# Patient Record
Sex: Female | Born: 1985
Health system: Southern US, Community
[De-identification: ages and names within clinical notes are randomized; demographics above are authoritative.]

## PROBLEM LIST (undated history)

## (undated) DIAGNOSIS — F419 Anxiety disorder, unspecified: Secondary | ICD-10-CM

## (undated) DIAGNOSIS — Z720 Tobacco use: Secondary | ICD-10-CM

## (undated) HISTORY — PX: NO PAST SURGERIES: SHX2092

## (undated) HISTORY — DX: Tobacco use: Z72.0

---
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
## 2006-05-25 ENCOUNTER — Emergency Department (HOSPITAL_COMMUNITY): Admission: EM | Admit: 2006-05-25 | Discharge: 2006-05-26 | Payer: Self-pay | Admitting: Emergency Medicine

## 2007-01-02 ENCOUNTER — Telehealth (INDEPENDENT_AMBULATORY_CARE_PROVIDER_SITE_OTHER): Payer: Self-pay | Admitting: Family Medicine

## 2007-03-27 ENCOUNTER — Telehealth: Payer: Self-pay | Admitting: *Deleted

## 2007-10-03 ENCOUNTER — Ambulatory Visit: Payer: Self-pay | Admitting: Family Medicine

## 2007-10-03 DIAGNOSIS — E049 Nontoxic goiter, unspecified: Secondary | ICD-10-CM

## 2007-10-03 DIAGNOSIS — R030 Elevated blood-pressure reading, without diagnosis of hypertension: Secondary | ICD-10-CM

## 2007-10-03 DIAGNOSIS — R000 Tachycardia, unspecified: Secondary | ICD-10-CM

## 2008-03-28 ENCOUNTER — Emergency Department (HOSPITAL_COMMUNITY): Admission: EM | Admit: 2008-03-28 | Discharge: 2008-03-28 | Payer: Self-pay | Admitting: Emergency Medicine

## 2008-09-02 ENCOUNTER — Encounter: Payer: Self-pay | Admitting: Family Medicine

## 2008-10-28 ENCOUNTER — Inpatient Hospital Stay (HOSPITAL_COMMUNITY): Admission: AD | Admit: 2008-10-28 | Discharge: 2008-10-28 | Payer: Self-pay | Admitting: Obstetrics & Gynecology

## 2009-10-31 ENCOUNTER — Encounter (INDEPENDENT_AMBULATORY_CARE_PROVIDER_SITE_OTHER): Payer: Self-pay | Admitting: *Deleted

## 2009-10-31 DIAGNOSIS — F17200 Nicotine dependence, unspecified, uncomplicated: Secondary | ICD-10-CM

## 2010-06-10 ENCOUNTER — Emergency Department (HOSPITAL_COMMUNITY): Admission: EM | Admit: 2010-06-10 | Discharge: 2010-06-10 | Payer: Self-pay | Admitting: Emergency Medicine

## 2010-07-18 ENCOUNTER — Ambulatory Visit (HOSPITAL_COMMUNITY): Admission: RE | Admit: 2010-07-18 | Discharge: 2010-07-18 | Payer: Self-pay | Admitting: Obstetrics

## 2010-09-16 ENCOUNTER — Inpatient Hospital Stay (HOSPITAL_COMMUNITY)
Admission: AD | Admit: 2010-09-16 | Discharge: 2010-09-16 | Payer: Self-pay | Source: Home / Self Care | Admitting: Obstetrics & Gynecology

## 2010-10-07 ENCOUNTER — Inpatient Hospital Stay (HOSPITAL_COMMUNITY)
Admission: AD | Admit: 2010-10-07 | Discharge: 2010-10-07 | Payer: Self-pay | Source: Home / Self Care | Attending: Obstetrics & Gynecology | Admitting: Obstetrics & Gynecology

## 2010-11-28 NOTE — Assessment & Plan Note (Signed)
Summary: NP/ high blood pressure/last chance per denya   Vital Signs:  Patient Profile:   25 Years Old Female Weight:      152 pounds Temp:     98.2 degrees F Pulse rate:   138 / minute BP sitting:   137 / 80  (left arm)  Pt. in pain?   no  Vitals Entered By: Jacki Cones RN (October 03, 2007 3:36 PM)                  Serial Vital Signs/Assessments:  Time      Position  BP       Pulse  Resp  Temp     By                              56                    Lequita Asal  MD  Comments: repeat HR at end of visit By: Lequita Asal  MD    Visit Type:  Office Visit PCP:  Ancil Boozer  MD  Chief Complaint:  elevated bp when getting implanon x1 month ago.  History of Present Illness: Pt is a 25 y/o female who presents for evaluation for elevated BP. Pt states that when she went to get Implanon 1 month ago, she was told that she had an elevated BP. Denies h/o elevated BPs in the past. Given extensive history of HTN in her family, her mother thought it prudent for her to present for evaluation. Pt currently taking Seroquel for Bipolar disorder. Pt endorses occasional chest pain which per her paper medical record was worked up several years ago when she was 14, with negative work-up. Denies headache, peripheral edema, DOE, or visual changes. Pt does endorse frequent marijuana use and heavy EtOH use on weekends.      Family History:    Family History Hypertension   Risk Factors:  Tobacco use:  current    Cigarettes:  Yes -- 1 pack(s) per day    Counseled to quit/cut down tobacco use:  yes Drug use:  yes    Substance:  marijuana Alcohol use:  yes    Type:  Beer    Drinks per day:  4+    Counseled to quit/cut down alcohol use:  yes    Physical Exam  General:     Well-developed,well-nourished,in no acute distress; alert,appropriate and cooperative throughout examination Eyes:     No corneal or conjunctival inflammation noted. EOMI. Perrla. Funduscopic exam  benign, without hemorrhages, exudates or papilledema. Vision grossly normal. Lungs:     Normal respiratory effort, chest expands symmetrically. Lungs are clear to auscultation, no crackles or wheezes. Heart:     Normal rate and regular rhythm. S1 and S2 normal without gallop, murmur, click, rub or other extra sounds. Pulses:     R and L radial, dorsalis pedis and posterior tibial pulses are full and equal bilaterally Extremities:     No clubbing, cyanosis, edema, or deformity noted with normal full range of motion of all joints.   Psych:     Oriented X3, normally interactive, and judgment poor.      Impression & Recommendations:  Problem # 1:  ELEVATED BP READING WITHOUT DX HYPERTENSION (ICD-796.2) Assessment: New Given isolated elevated BP at [facility], and high normal SBP today, cannot diagnose HTN at this time. Pt counseled to measure BPs as an  outpatient at various times during the day for several weeks and to record the readings. If she gets multiple readings >140/90, she is to call the [facility]. Counseled to decrease alcohol intake and to stop smoking, and to engage at least 30 minutes of aerobic exercise daily in order to assist in prevention of development of HTN. Pt also counseled to increase intake of fruits and green leafy vegetables and to decrease salt intake.   Orders: [facility]- Est Level  3 ([id number])   Problem # 2:  TACHYCARDIA (ICD-785.0) Assessment: New pt significantly tachycardic at initial assessment. likely secondary to anxiety, as it decreased to 59 by end of visit. given h/o goiter, may need further w/u if persists in future.    Patient Instructions: 1)  Please schedule a follow-up appointment in 1 month with PCP, Dr. Sandi Mealy 2)  Tobacco is very bad for your health and your loved ones! You Should stop smoking!. 3)  Check your Blood Pressure regularly. Write down the readings and bring to your follow up visit. If multiple readings greater than 140/90, then call and make an  appointment sooner.  4)  It is not healthy  for women to drink more than 1-2 drinks per day. 5)  If you have problems with constipation, you may use Miralax 17g one to two times per day. It is available as an over-the-counter medication.  6)  Drink lots and lots of water and increase the amount of fiber and green leafy vegetables you eat.     ]

## 2010-11-28 NOTE — Miscellaneous (Signed)
Summary: Tobacco Tonya Scott  Clinical Lists Changes  Problems: Added new problem of TOBACCO Tonya Scott (ICD-305.1) 

## 2011-01-12 LAB — URINALYSIS, ROUTINE W REFLEX MICROSCOPIC
Bilirubin Urine: NEGATIVE
Glucose, UA: NEGATIVE mg/dL
Hgb urine dipstick: NEGATIVE
Ketones, ur: NEGATIVE mg/dL
Nitrite: NEGATIVE
Protein, ur: NEGATIVE mg/dL
Specific Gravity, Urine: 1.014 (ref 1.005–1.030)
Urobilinogen, UA: 1 mg/dL (ref 0.0–1.0)
pH: 7.5 (ref 5.0–8.0)

## 2011-01-12 LAB — POCT PREGNANCY, URINE: Preg Test, Ur: POSITIVE

## 2011-03-08 ENCOUNTER — Ambulatory Visit: Payer: Self-pay | Admitting: Family Medicine

## 2011-03-13 ENCOUNTER — Encounter: Payer: Self-pay | Admitting: Family Medicine

## 2011-03-13 ENCOUNTER — Ambulatory Visit (INDEPENDENT_AMBULATORY_CARE_PROVIDER_SITE_OTHER): Payer: Medicaid Other | Admitting: Family Medicine

## 2011-03-13 DIAGNOSIS — Z3009 Encounter for other general counseling and advice on contraception: Secondary | ICD-10-CM

## 2011-03-13 DIAGNOSIS — E049 Nontoxic goiter, unspecified: Secondary | ICD-10-CM

## 2011-03-13 DIAGNOSIS — F17200 Nicotine dependence, unspecified, uncomplicated: Secondary | ICD-10-CM

## 2011-03-13 NOTE — Patient Instructions (Signed)
It was nice to meet you. I will call or send letter with results of your thyroid test. Please schedule an appointment this Thursday morning w/ Dr. Jennette Kettle in Procedure clinic. Please schedule an adult physical appointment in 6 months. Thanks,  Dr. Sherron Flemings Tonya Scott

## 2011-03-14 ENCOUNTER — Encounter: Payer: Self-pay | Admitting: Family Medicine

## 2011-03-14 DIAGNOSIS — Z3009 Encounter for other general counseling and advice on contraception: Secondary | ICD-10-CM | POA: Insufficient documentation

## 2011-03-14 NOTE — Assessment & Plan Note (Signed)
Patient's goal is to quit smoking, but not ready to quit right now.  She smokes about 4 cigarettes per day, and smokes more when out drinking with friends on weekends.  Will continue to counsel patient about risks of smoking at subsequent visits.

## 2011-03-14 NOTE — Assessment & Plan Note (Signed)
Patient doing well post-partum.  Wants Implanon for [facility].  Will refer to Procedure Clinic this Thursday to insert Implanon.

## 2011-03-14 NOTE — Progress Notes (Signed)
  Subjective:    Patient ID: Tonya Scott, female    DOB: Jan 20, 1986, 25 y.o.   MRN: [id number]  HPI 25 year old female here for post partum visit and to meet new PCP.  Patient delivered viable female infant on 12/15/10.  No complications post-partum.  She is doing well overall.  No bleeding/spotting, abdominal pain or cramping.  Denies any post-partum fever, signs of infection.  Denies any symptoms of depression.  Currently sexually active without any form of birth control.  Wants Implanon for [facility].  Has used in the past.  Currently smokes about 4 cigarettes/day.  Goal for smoking: hopes to quit someday, but not ready at this time.   Review of Systems Per HPI    Objective:   Physical Exam  Constitutional: She appears well-developed and well-nourished. No distress.  Neck: Neck supple.  Cardiovascular: Normal rate and regular rhythm.  Exam reveals no gallop and no friction rub.   No murmur heard. Pulmonary/Chest: Breath sounds normal. No respiratory distress. She has no wheezes. She has no rales.  Abdominal: Soft. Bowel sounds are normal. She exhibits no distension. There is no tenderness.  Neurological: She is alert.  Skin: Skin is warm. No rash noted.          Assessment & Plan:

## 2011-03-14 NOTE — Assessment & Plan Note (Signed)
Patient has a history of thyroid disease.  She says thyroid studies have been normal in the past.  Concerned she has gained a significant amount of weight even before pregnancy.  Will order TSH level.  Patient to have this drawn at next visit (this Thursday).  Will call or send letter with results.

## 2011-03-15 ENCOUNTER — Encounter: Payer: Self-pay | Admitting: Family Medicine

## 2011-03-15 ENCOUNTER — Ambulatory Visit (INDEPENDENT_AMBULATORY_CARE_PROVIDER_SITE_OTHER): Payer: Medicaid Other | Admitting: Family Medicine

## 2011-03-15 DIAGNOSIS — E039 Hypothyroidism, unspecified: Secondary | ICD-10-CM

## 2011-03-15 DIAGNOSIS — Z309 Encounter for contraceptive management, unspecified: Secondary | ICD-10-CM

## 2011-03-15 DIAGNOSIS — Z3046 Encounter for surveillance of implantable subdermal contraceptive: Secondary | ICD-10-CM

## 2011-03-15 DIAGNOSIS — Z30017 Encounter for initial prescription of implantable subdermal contraceptive: Secondary | ICD-10-CM

## 2011-03-15 HISTORY — PX: OTHER SURGICAL HISTORY: SHX169

## 2011-03-15 MED ORDER — ETONOGESTREL 68 MG ~~LOC~~ IMPL
1.0000 | DRUG_IMPLANT | Freq: Once | SUBCUTANEOUS | Status: AC
  Administered 2011-03-15: 1 via SUBCUTANEOUS

## 2011-03-15 NOTE — Progress Notes (Signed)
  Subjective:    Patient ID: Tonya Scott, female    DOB: [birth year], 25 y.o.   MRN: [id number]  HPI Wants implanon Had one before and liked it a lot. Had baby 2 1/2 m old   Review of Systems    Pertinent review of systems: negative for fever or unusual weight change.  Objective:   Physical Exam     ARM: no lesions or rash Pregnancy test neg Patient given informed consent, signed copy in the chart. Pregnancy test was negative. Appropriate time out taken. Patient's left arm was prepped and draped in the usual sterile fashion.. The ruler used to measure and mark insertion area.  Pt was prepped with alcohol swab and then injected with 2 cc of 1% lidocaine with epinephrine. Pt the prepped with betadine, Implanon removed form packaging,  Device confirmed in needle, then inserted full length of needle and withdrawn per handbook instructions.  Pt insertion site covered with pressure bandage.  Minimal blood loss. Pt tolerated the procedure well.     Assessment & Plan:  Contraceptive management implanin insertion

## 2011-03-16 ENCOUNTER — Emergency Department (HOSPITAL_COMMUNITY)
Admission: EM | Admit: 2011-03-16 | Discharge: 2011-03-16 | Disposition: A | Discharge status: Home / Self Care | Payer: Medicare Other | Attending: Emergency Medicine | Admitting: Emergency Medicine

## 2011-03-16 DIAGNOSIS — Z23 Encounter for immunization: Secondary | ICD-10-CM | POA: Insufficient documentation

## 2011-03-16 DIAGNOSIS — Z203 Contact with and (suspected) exposure to rabies: Secondary | ICD-10-CM | POA: Insufficient documentation

## 2011-03-16 NOTE — Discharge Summary (Signed)
[facility]  Patient:    Tonya Scott, Tonya Scott                        MRN: [id number] Adm. Date:  [id number] Disc. Date: 09/30/00 Attending:  Veneta Penton                           Discharge Summary  REASON FOR ADMISSION:  This 25 year old black female was admitted for threatening to kill herself and her mother.  She was unable to contract for safety at that time.  For further history of present illness, please see the patients psychiatric admission assessment.  PHYSICAL EXAMINATION:  Her physical examination at the time of admission was unremarkable with the exception of a mild click or heart murmur heard on physical examination.  It is recommended that the patient follow up with her primary care physician and that an echocardiogram be considered.  Her physical examination was underwent unremarkable.  LABORATORY EXAMINATION:  The patient underwent a laboratory work-up to rule out any medical problems contributing to her symptomatology.  Urine probe for chlamydia and gonorrhea was negative.  A CBC showed a white count of 4.1000, MCHC was 34.7 and CBC was underwent unremarkable.  A metabolic panel was within normal limits. Thyroid function tests were within normal limits.  Urine pregnancy test was negative.  A hepatic panel was within normal limits.  Urine drug screen was positive for amphetamines and consistent with patient taking Adderall for ADHD.  UA was unremarkable.  The patient received no x-rays, no special procedures and no additional consultations during the course of this hospitalization.  She sustained no complications during the course of her hospitalization.  HOSPITAL COURSE:  The patient was oppositional, defiant, angry, irritable and depressed on admission.  She was offered a trial of antidepressant medication, but refused this stating that she, herself, did not feel depressed and did not want to take any antidepressant medicine.  She  did report that her primary problem was her lack of ability to maintain her focus and this prevented her from being able to manage her school work as well as pay attention in class. She reported having a great deal of difficulty as well with impulse control and controlling her anger.  She was begun on a trial of Dexedrine Spansules and tolerated this well.  She reports that this is improving things greater than her previous trial of Adderall.  She was discontinued from Risperdal as the patient has denied any psychotic symptomatology since she reports having had voices four months ago but they have been in remission since that time on a low dose of Risperdal. She has been off Risperdal now for approximately six days without any recurrent psychotic symptomatology.  Her psychotherapy while hospitalized has focused on decreasing cognitive distortions, improving her impulse control and she has done well.  At the time of discharge she is participating in all aspects of the therapeutic treatment program.  Her concentration and attention span have improved as has her affect and mood.  As she is participating in all aspects of the therapeutic treatment program, denies any suicidal or homicidal ideation, is adequately able to perform her activities of daily living, it is felt that the patient has reached her maximum benefits of hospitalization and is ready for discharge to a less restrictive alternative setting.  CONDITION ON DISCHARGE:  Improved.  DIAGNOSIS ACCORDING TO DSM-4: AXIS I.   1. Major  depression, single episode, severe without psychosis.           2. Rule out adjustment disorder with mixed disturbance of conduct              and emotions.           3. Attention deficit hyperactivity disorder combined type.           4. Conduct disorder.           5. Rule out oppositional defiant disorder.           6. Psychotic disorder in remission. AXIS II.  1. Antisocial traits.           2. Rule  out personality disorder.           3. Rule out learning disorder not otherwise specified. AXIS III. 1. Rule out mitral valve prolapse.           2. Rule out cardiovascular malformation. AXIS IV.  Current psychosocial stressors are severe. AXIS V.   Code 20 on admission and code 30 on discharge.  FURTHER EVALUATION AND TREATMENT RECOMMENDATIONS: 1. The patient is to follow up with her primary care physician within the    next two to four weeks to reevaluate the soft murmur that was heard on    initial physical examination and to rule out any vascular heart disease. 2. The patient is discharged on Dexedrine Spansules 10 mg p.o. q.a.m. 3. The patient is discharged on an unrestricted level of activity and a    regular diet. 4. The patient will follow up with the [facility] for    all further aspects of her mental health care and consequently I will    sign off on the case at this time. DD:  09/30/00 TD:  09/30/00 Job: [id number]

## 2011-03-16 NOTE — H&P (Signed)
[facility]  Patient:    Tonya Scott, Tonya Scott                        MRN: [id number] Adm. Date:  [id number] Attending:  Veneta Penton                   Psychiatric Admission Assessment  DATE OF ADMISSION:  September 24, 2000  PATIENT IDENTIFICATION:  This 25 year old black female was admitted after threatening to kill herself and her mother.  She was unable to contract for safety and refused to reveal her plans for harming herself and her mother.  HISTORY OF PRESENT ILLNESS:  The patient has been residing at the [facility].  The staff there reports that she has been responding to hallucinations over the past several weeks and that this has been worsening over the past week.  The patient denies this.  She does admit to having had hallucinations approximately four months ago when she was put on Risperdal. At that time, the hallucinations resolved.  She reported them as command auditory hallucinations telling her to hurt herself and others.  She also reports that she has been refusing to attend school.  She admits to an increasingly irritable, angry, and probably depressed mood most of the day nearly every day along with anhedonia, decreased hygiene, giving up on all activities that previously enjoyed, decreased appetite, insomnia, feelings of hopelessness, helplessness, worthlessness, excessive and inappropriate guilt, fatigue, loss of energy, decreased concentration, recurrent thoughts of death. She left a suicide note last week and has been verbalizing suicide for the past several weeks.  Psychosocial stressors include the fact that stepfather and grandmother died approximately two years ago after which mother reports that the patients behavior deteriorated and she has had repeated episodes of depression.  PAST PSYCHIATRIC HISTORY:  Hospitalized at [facility] as an inpatient in the early part of October 2001 because of homicidal  ideation directed toward her mother.  She had an episode of auditory hallucinations four months ago as described above that had done well on Risperdal and symptoms resolved.  The patient has a longstanding history of conduct disorder according to her mother who reports that the patient has been out of control with regard to her behavior for the past two years.  Problematic behaviors have included assaultive behavior, getting into fights with peers, she frequently lies and has had at least one episode of running away overnight. She has a longstanding history of ADHD since early childhood with symptoms of hyperactivity, poor impulse control, decreased concentration and attention span.  She has no past legal history.  She denies any other psychiatric history.  SUBSTANCE ABUSE HISTORY:  She refused to discuss any drug or alcohol use.  PAST MEDICAL HISTORY:  Current medications include Adderall 10 mg b.i.d. and Risperdal 0.5 mg q.h.s. but the patient reports she has been noncompliant with taking the medicine.  She has no known drug allergies or sensitivities.  Past medical history: Questionable history of an arrhythmia at age 65 that has not been present for a period of eight years; however, the patient also does report intermittent chest pain and physical examination by the physicians assistant found the patient to have either a soft murmur or possible click.  SOCIAL HISTORY:  There is no other family history available at this time.  The patient is currently attending eighth grade but doing poorly in school and refusing to go.  MENTAL STATUS EXAMINATION:  The patient presents as a well-developed, well-nourished, obese adolescent black female who is alert and oriented x 4, oppositional, defiant, and angry, disheveled, unkempt with poor impulse control, poor hygiene, decreased concentration and attention span.  She is hyperactive, psychomotor agitated, suspicious and guarded.  Speech  is pressured.  She displays no phonemic errors, looseness of associations, or evidence of a thought disorder.  Affect and mood are irritable, angry, and depressed.  She appears to be easily distractible although at the present time she does not appear to be responding to any internal stimuli.  Insight is poor, judgment is poor.  Intelligence appears to be average.  Similarities and differences are within normal limits.  Her proverbs are somewhat concrete and consistent with her educational level.  Thought processes appear to be generally goal directed.  ADMISSION DIAGNOSES: Axis I:    1. Major depression, single episode, severe without psychosis.            2. Attention-deficit/hyperactivity disorder, combined type.            3. Conduct disorder.            4. Rule out oppositional defiant disorder.            5. Rule out psychotic disorder, not otherwise specified. Axis II:   1. Antisocial traits.            2. Rule out personality disorder. Axis III:  1. Rule out mitral valve prolapse.            2. Rule out other cardiovascular malformation. Axis IV:   Severe. Axis V:    20.  ASSETS AND STRENGTHS:  She is of average intelligence and well connected into the DSS system.  INITIAL PLAN OF CARE:  Begin the patient on Dexedrine Spansules as she agree that she has attention-deficit disorder and has difficulty with concentrating in school and would like to improve there.  She refuses a trial of antidepressant medication at this time as she states that she does not feel depressed and does not want to go on an antidepressant at this time. Psychotherapy will focus on improving the patients impulse control, decreasing cognitive distortions, improving her anger management skills, and decreasing potential for harm to self and others.  A laboratory workup will also be initiated to rule out any medical problems contributing to her symptomatology.  An ECG will also be obtained electrocardiogram  will be considered or possibly considered for an outpatient basis.  ESTIMATED LENGTH OF STAY:  Five to seven days.  POST HOSPITAL CARE PLAN:  Discharge the patient back to care of her group home.DD:  09/25/00  TD:  09/25/00 Job: [id number]

## 2011-03-19 ENCOUNTER — Encounter: Payer: Self-pay | Admitting: Family Medicine

## 2011-03-22 ENCOUNTER — Ambulatory Visit: Payer: Medicaid Other

## 2011-03-23 ENCOUNTER — Ambulatory Visit: Payer: Medicaid Other

## 2011-04-13 ENCOUNTER — Ambulatory Visit: Payer: Medicaid Other | Admitting: Sports Medicine

## 2011-04-19 ENCOUNTER — Encounter: Payer: Self-pay | Admitting: Family Medicine

## 2011-04-19 ENCOUNTER — Ambulatory Visit (INDEPENDENT_AMBULATORY_CARE_PROVIDER_SITE_OTHER): Payer: Medicaid Other | Admitting: Family Medicine

## 2011-04-19 DIAGNOSIS — Z23 Encounter for immunization: Secondary | ICD-10-CM

## 2011-04-19 DIAGNOSIS — Z20828 Contact with and (suspected) exposure to other viral communicable diseases: Secondary | ICD-10-CM | POA: Insufficient documentation

## 2011-04-19 DIAGNOSIS — N760 Acute vaginitis: Secondary | ICD-10-CM | POA: Insufficient documentation

## 2011-04-19 LAB — POCT WET PREP (WET MOUNT)
Trichomonas Wet Prep HPF POC: NEGATIVE
Yeast Wet Prep HPF POC: NEGATIVE

## 2011-04-19 NOTE — Progress Notes (Signed)
  Subjective:    Patient ID: Tonya Scott, female    DOB: 09/20/1986, 25 y.o.   MRN: [id number]  HPI  1) STD check: Patient with mild vaginal discharge without odor. Would like STD testing - would also like testing for viral STD HIV and syphilis. Denies abdominal pain, nausea , emesis, diarrhea, vaginal pain, dysuria, fever, chills, weight loss. Sexually active and does not use protection. Implanon in place.   Past history reviewed.   Would also like tetanus vaccine today (booster)  Review of Systems As per HPI    Objective:   Physical Exam General: pleasant, NAD  GU: scant clear vaginal discharge without odor, no cervical motion or uterine tenderness       Assessment & Plan:

## 2011-04-19 NOTE — Assessment & Plan Note (Signed)
HIV, RPR today at patient request. Counseled on safe sexual practices.

## 2011-04-19 NOTE — Progress Notes (Signed)
Addended by: Garen Grams F on: 04/19/2011 09:45 AM   Modules accepted: Orders

## 2011-04-19 NOTE — Patient Instructions (Signed)
I will call you with your results.  It was great to meet you today! Follow up as needed.  - Dr. Wallene Huh

## 2011-04-19 NOTE — Assessment & Plan Note (Addendum)
GC/CT/wet prep today. Follow up results. Counseled on safe sexual practices.   - Bacterial vaginosis. Called in metronidazole treatment and notified patient.

## 2011-04-20 LAB — GC/CHLAMYDIA PROBE AMP, GENITAL: GC Probe Amp, Genital: NEGATIVE

## 2011-04-24 MED ORDER — METRONIDAZOLE 500 MG PO TABS: 500.0000 mg | ORAL_TABLET | Freq: Two times a day (BID) | ORAL | Status: AC

## 2011-04-24 NOTE — Progress Notes (Signed)
Addended by: Gennette Pac on: 04/24/2011 12:03 PM   Modules accepted: Orders

## 2011-06-21 ENCOUNTER — Telehealth: Payer: Self-pay | Admitting: Family Medicine

## 2011-06-21 NOTE — Telephone Encounter (Signed)
The referral for Home Care did not have any Dx codes on it.  She we like the referral updated and faxed back.

## 2011-06-21 NOTE — Telephone Encounter (Signed)
Referral information done and faxed back .Marland KitchenLoralee Scott [place]

## 2011-07-03 ENCOUNTER — Encounter: Payer: Self-pay | Admitting: Family Medicine

## 2011-07-03 DIAGNOSIS — F319 Bipolar disorder, unspecified: Secondary | ICD-10-CM | POA: Insufficient documentation

## 2011-11-07 ENCOUNTER — Ambulatory Visit (INDEPENDENT_AMBULATORY_CARE_PROVIDER_SITE_OTHER): Payer: Medicare Other | Admitting: Family Medicine

## 2011-11-07 ENCOUNTER — Encounter: Payer: Self-pay | Admitting: Family Medicine

## 2011-11-07 VITALS — BP 146/91 | HR 102 | Temp 98.4°F | Ht 67.0 in | Wt 210.0 lb

## 2011-11-07 DIAGNOSIS — M545 Low back pain, unspecified: Secondary | ICD-10-CM | POA: Insufficient documentation

## 2011-11-07 DIAGNOSIS — H43393 Other vitreous opacities, bilateral: Secondary | ICD-10-CM | POA: Insufficient documentation

## 2011-11-07 DIAGNOSIS — H43399 Other vitreous opacities, unspecified eye: Secondary | ICD-10-CM

## 2011-11-07 MED ORDER — MELOXICAM 15 MG PO TABS: 15.0000 mg | ORAL_TABLET | Freq: Every day | ORAL | Status: DC

## 2011-11-07 NOTE — Patient Instructions (Addendum)
It was nice meeting you today.  I would like for you to try the meloxicam for your low back pain Your blood pressure has looked fine at other appointments so I do not believe that this is causing the spots in your eye, I would like to send you to an eye doctor so that the can visualize the back on the eye more clearly.   If you have questions please call our clinic

## 2011-11-11 NOTE — Assessment & Plan Note (Signed)
Unable to to visualize disc.  Unsure of etiology, she does have elevated blood pressure but looks to be isolated.  Concerned that this is affecting her vision, will refer to ophtho for further evaluation.

## 2011-11-11 NOTE — Progress Notes (Signed)
  Subjective:    Patient ID: Tonya Scott, female    DOB: 1985/11/11, 26 y.o.   MRN: [id number]  HPI  1.  Spots in vision:  Noticed spots and floaters in her vision ~1 month ago.  Notices these several times per day, and makes it difficult to see at times.  Describes floaters as "black spots" with some green "flashes"  This is mad worse when she lays down.  She does have occasional headaches, however these aren't related to her visual disturbances.  She denies any trauma.      2.  Low back pain:  Has had low back pain since childbirth ~10 mos ago.  Feels like tightness in her back.  Pain does not radiate, thinks this may be from her epidural.  She denies bowel/bladder incontinence or numbness down the legs.   Review of Systems     Objective:   Physical Exam  Constitutional: She is oriented to person, place, and time. She appears well-developed and well-nourished. No distress.  HENT:  Head: Normocephalic and atraumatic.  Eyes: Conjunctivae and EOM are normal. Pupils are equal, round, and reactive to light.       Unable to visualize disc, no av nicking seen  Neck: Neck supple.  Cardiovascular: Normal rate, regular rhythm and normal heart sounds.   Pulmonary/Chest: Effort normal and breath sounds normal.  Musculoskeletal:       SLR normal, gait normal.    Neurological: She is alert and oriented to person, place, and time. No cranial nerve deficit.          Assessment & Plan:

## 2011-11-11 NOTE — Assessment & Plan Note (Signed)
No red flags, will treat with NSAIDS.  Muscle relaxants offered, does not want anything sedating.

## 2011-11-12 ENCOUNTER — Telehealth: Payer: Self-pay | Admitting: *Deleted

## 2011-11-12 NOTE — Telephone Encounter (Signed)
Called patient and informed of appointment with [facility] on 11/16/11 at 10:30 am.Busick, Rodena Medin

## 2012-02-27 ENCOUNTER — Ambulatory Visit: Payer: Medicare Other | Admitting: Family Medicine

## 2012-03-07 ENCOUNTER — Encounter: Payer: Self-pay | Admitting: *Deleted

## 2012-03-27 NOTE — Telephone Encounter (Signed)
This encounter was created in error - please disregard.

## 2012-04-21 ENCOUNTER — Ambulatory Visit: Payer: Medicare Other | Admitting: Family Medicine

## 2012-05-12 ENCOUNTER — Ambulatory Visit: Payer: Medicare Other | Admitting: Family Medicine

## 2012-06-09 ENCOUNTER — Telehealth: Payer: Self-pay | Admitting: Family Medicine

## 2012-06-09 ENCOUNTER — Ambulatory Visit: Payer: Medicare Other | Admitting: Family Medicine

## 2012-06-09 NOTE — Telephone Encounter (Signed)
Opened in error

## 2012-06-11 ENCOUNTER — Ambulatory Visit (INDEPENDENT_AMBULATORY_CARE_PROVIDER_SITE_OTHER): Payer: Medicare Other | Admitting: Family Medicine

## 2012-06-11 ENCOUNTER — Encounter: Payer: Self-pay | Admitting: Family Medicine

## 2012-06-11 VITALS — BP 144/83 | HR 116 | Ht 67.0 in | Wt 228.0 lb

## 2012-06-11 DIAGNOSIS — R609 Edema, unspecified: Secondary | ICD-10-CM

## 2012-06-11 DIAGNOSIS — E049 Nontoxic goiter, unspecified: Secondary | ICD-10-CM

## 2012-06-11 DIAGNOSIS — R600 Localized edema: Secondary | ICD-10-CM

## 2012-06-11 NOTE — Patient Instructions (Addendum)
Thank you for coming in today, it was good to see you We will check your thyroid today to see if that may be contributing to your symptoms It can be normal for you to have some swelling in your feet at the end of the day, be sure to keep your feet elevated when you are resting.  I will see you back in 1-2 month to see how things are going

## 2012-06-12 LAB — TSH: TSH: 0.755 u[IU]/mL (ref 0.350–4.500)

## 2012-06-15 DIAGNOSIS — R600 Localized edema: Secondary | ICD-10-CM | POA: Insufficient documentation

## 2012-06-15 NOTE — Progress Notes (Signed)
  Subjective:    Patient ID: Tonya Scott, female    DOB: 1986/09/10, 26 y.o.   MRN: [id number]  HPI 1. Foot swelling:  Has noticed that she has had bilateral foot swelling since having baby in February.  Swelling worse at the end of the day when she has been on her feet for a long period of time.  Swelling is gone when she wakes in the morning.  She states she has had trouble losing weight since baby was born and thinks that swelling and difficulty with weight loss may be related to thyroid.  She has had hx of goiter and received radioactive Iodine.  She has never been on supplementation.  She also describes "palpitations" but she relates this to being similar to panic attacks she has had before.  She denies hot/cold intolerance, headache, pain in legs, chest pain, shortness of breath.    Review of Systems Per HPI    Objective:   Physical Exam  Constitutional:       Obese female, NAD   HENT:  Mouth/Throat: Oropharynx is clear and moist.  Neck: Neck supple.       Thyroid mildly enlarged, symmetric,  non tender.   Cardiovascular: Normal rate and regular rhythm.   Pulmonary/Chest: Effort normal.  Musculoskeletal: She exhibits edema (Trace).  Neurological: She is alert.          Assessment & Plan:

## 2012-06-15 NOTE — Assessment & Plan Note (Signed)
Worsening leg edema since delivery of child in 2/2.  Description sounds like this is dependent edema.  No signs of DVT or heart failure. Cause likely related to venous insufficiency 2/2 to obesity.  However given history of thyroid disease, I will check a TSH.  Advised weight loss staring with dietary changes and exercise program.

## 2012-06-17 ENCOUNTER — Telehealth: Payer: Self-pay | Admitting: Family Medicine

## 2012-06-17 NOTE — Telephone Encounter (Signed)
Would like to to know results of labs

## 2012-06-17 NOTE — Telephone Encounter (Signed)
Fwd. To Dr.Matthews. .Tonya Scott  

## 2012-06-17 NOTE — Telephone Encounter (Signed)
Called and given results of thyroid testing.  Tests were normal.

## 2012-07-25 ENCOUNTER — Encounter: Payer: Self-pay | Admitting: Family Medicine

## 2012-07-25 ENCOUNTER — Ambulatory Visit (INDEPENDENT_AMBULATORY_CARE_PROVIDER_SITE_OTHER): Payer: Medicare Other | Admitting: Family Medicine

## 2012-07-25 VITALS — BP 121/83 | HR 100 | Ht 67.0 in | Wt 230.6 lb

## 2012-07-25 DIAGNOSIS — Z3046 Encounter for surveillance of implantable subdermal contraceptive: Secondary | ICD-10-CM

## 2012-07-27 DIAGNOSIS — Z3046 Encounter for surveillance of implantable subdermal contraceptive: Secondary | ICD-10-CM | POA: Insufficient documentation

## 2012-07-27 NOTE — Progress Notes (Signed)
  Subjective:    Patient ID: Tonya Scott, female    DOB: [birth year], 26 y.o.   MRN: [id number]  HPI  1. Contraception management:  Here for nexplanon removal.  Wishes to have this removed because she feels it may be contributing to her gaining weight.  She is unsure what she would like to use for contraception after removal.  She would like to use OCP but she is a smoker.    Review of Systems Denies irregular or heavy menses.      Objective:   Physical Exam  PROCEDURE NOTE: Nexplanon Removal Patient given informed consent for removal of her implanon.  Signed copy in the chart.  Appropriate time out taken.   Nexplanon site identified.  Area prepped in usual sterile fashon. One cc of 1% lidocaine was used to anesthetize the area at the distal end of the implant. A small stab incision was made right beside the implant on the distal portion. The implanon rod was grasped using hemostats and removed without difficulty.  There was less than 3 cc blood loss. There were no complications.  A small amount of antibiotic ointment and steri-strips were applied over the small incision.  A pressure bandage was applied to reduce any bruising.  The patient tolerated the procedure well and was given post procedure instructions.   Supervised by Dr. Gwendolyn Grant    Assessment & Plan:

## 2012-07-27 NOTE — Assessment & Plan Note (Signed)
Nexplanon removed, patient tolerated procedure well.  She is undecided on another form of contraception at this time.  I did advise against combo OCP due to her continuing to smoke.  Discussed IUD, she will think about this.

## 2012-12-24 ENCOUNTER — Encounter: Payer: Medicare Other | Admitting: Family Medicine

## 2013-05-08 ENCOUNTER — Telehealth: Payer: Self-pay | Admitting: Emergency Medicine

## 2013-05-12 ENCOUNTER — Ambulatory Visit: Payer: Medicare Other | Admitting: Family Medicine

## 2013-05-13 NOTE — Telephone Encounter (Signed)
error 

## 2013-05-20 ENCOUNTER — Ambulatory Visit: Payer: Medicare Other | Admitting: Emergency Medicine

## 2014-02-08 ENCOUNTER — Ambulatory Visit: Payer: Medicare Other | Admitting: Emergency Medicine

## 2015-05-10 ENCOUNTER — Encounter: Payer: Medicare Other | Admitting: Family Medicine

## 2016-12-19 ENCOUNTER — Ambulatory Visit (INDEPENDENT_AMBULATORY_CARE_PROVIDER_SITE_OTHER): Payer: Medicare Other | Admitting: Family Medicine

## 2016-12-19 ENCOUNTER — Other Ambulatory Visit (HOSPITAL_COMMUNITY)
Admission: RE | Admit: 2016-12-19 | Discharge: 2016-12-19 | Disposition: A | Discharge status: Home / Self Care | Payer: Medicare Other | Source: Ambulatory Visit | Attending: Family Medicine | Admitting: Family Medicine

## 2016-12-19 ENCOUNTER — Ambulatory Visit: Payer: Medicare Other | Admitting: Family Medicine

## 2016-12-19 VITALS — BP 119/80 | HR 81 | Temp 98.2°F | Ht 67.0 in | Wt 208.0 lb

## 2016-12-19 DIAGNOSIS — Z609 Problem related to social environment, unspecified: Secondary | ICD-10-CM | POA: Diagnosis not present

## 2016-12-19 DIAGNOSIS — N760 Acute vaginitis: Secondary | ICD-10-CM

## 2016-12-19 DIAGNOSIS — Z114 Encounter for screening for human immunodeficiency virus [HIV]: Secondary | ICD-10-CM | POA: Diagnosis not present

## 2016-12-19 DIAGNOSIS — B9689 Other specified bacterial agents as the cause of diseases classified elsewhere: Secondary | ICD-10-CM

## 2016-12-19 DIAGNOSIS — Z113 Encounter for screening for infections with a predominantly sexual mode of transmission: Secondary | ICD-10-CM | POA: Insufficient documentation

## 2016-12-19 DIAGNOSIS — E049 Nontoxic goiter, unspecified: Secondary | ICD-10-CM

## 2016-12-19 DIAGNOSIS — R5383 Other fatigue: Secondary | ICD-10-CM | POA: Diagnosis not present

## 2016-12-19 DIAGNOSIS — Z01419 Encounter for gynecological examination (general) (routine) without abnormal findings: Secondary | ICD-10-CM | POA: Insufficient documentation

## 2016-12-19 DIAGNOSIS — Z20828 Contact with and (suspected) exposure to other viral communicable diseases: Secondary | ICD-10-CM

## 2016-12-19 LAB — COMPLETE METABOLIC PANEL WITH GFR
ALT: 12 U/L (ref 6–29)
AST: 15 U/L (ref 10–30)
Albumin: 4.2 g/dL (ref 3.6–5.1)
Alkaline Phosphatase: 132 U/L — ABNORMAL HIGH (ref 33–115)
BILIRUBIN TOTAL: 0.8 mg/dL (ref 0.2–1.2)
BUN: 8 mg/dL (ref 7–25)
CALCIUM: 9.4 mg/dL (ref 8.6–10.2)
CO2: 23 mmol/L (ref 20–31)
CREATININE: 0.83 mg/dL (ref 0.50–1.10)
Chloride: 105 mmol/L (ref 98–110)
GFR, Est Non African American: >89 mL/min (ref >=60)
Glucose, Bld: 81 mg/dL (ref 65–99)
Potassium: 4 mmol/L (ref 3.5–5.3)
Sodium: 138 mmol/L (ref 135–146)
TOTAL PROTEIN: 7.3 g/dL (ref 6.1–8.1)

## 2016-12-19 LAB — POCT WET PREP (WET MOUNT)
CLUE CELLS WET PREP WHIFF POC: POSITIVE
TRICHOMONAS WET PREP HPF POC: ABSENT

## 2016-12-19 LAB — CBC
HCT: 44.3 % (ref 35.0–45.0)
Hemoglobin: 14.9 g/dL (ref 11.7–15.5)
MCH: 33.6 pg — ABNORMAL HIGH (ref 27.0–33.0)
MCHC: 33.6 g/dL (ref 32.0–36.0)
MCV: 99.8 fL (ref 80.0–100.0)
MPV: 10.2 fL (ref 7.5–12.5)
PLATELETS: 253 10*3/uL (ref 140–400)
RBC: 4.44 MIL/uL (ref 3.80–5.10)
RDW: 13.2 % (ref 11.0–15.0)
WBC: 8 10*3/uL (ref 3.8–10.8)

## 2016-12-19 LAB — TSH: TSH: 2.19 mIU/L

## 2016-12-19 NOTE — Patient Instructions (Signed)
Nice to meet you today. We are getting some labs and someone will call you or send you a letter with the results when they're available.  Please make a follow-up appointment for any concerns that you have that we were unable to address today.  Take care, Dr. BLeonard Schwartz

## 2016-12-19 NOTE — Progress Notes (Signed)
Subjective:   Tonya Scott is a 31 y.o. female with a history of Tobacco use, goiter, bipolar disorder here for STD testing and thyroid testing  STD testing LMP - end of January, not sure Sexually active with 1 female partner [phone]  No known exposure No current contraception  Thyroid issues Was told during pregnancy that thyroid was abnormal and needed f/u Remembers having a goiter at age 89 Had radioactive iodine uptake scan done (2001) - normal Weight gain since pregnancy Wants it rechecked Swelling in feet intermittently  Blood pressure concerns Patient also wonders if her blood pressure is okay today She's never had any blood pressure problems are taken any medication for blood pressure She took her blood pressure at [employer] 1 day and it was in the 160s systolic which concerned her  Review of Systems:  Per HPI.   Social History: Current smoker  Objective:  BP 119/80 (BP Location: Left Arm, Patient Position: Sitting, Cuff Size: Normal)   Pulse 81   Temp 98.2 F (36.8 C) (Oral)   Ht 5\' 7"  (1.702 m)   Wt 208 lb (94.3 kg)   SpO2 96%   BMI 32.58 kg/m   Gen:  31 y.o. female in NAD HEENT: NCAT, MMM, EOMI, PERRL, anicteric sclerae Neck: Supple, no LAD, +goiter CV: RRR, no MRG Resp: Non-labored, CTAB, no wheezes noted Abd: Soft, NTND, BS present, no guarding or organomegaly GYN:  External genitalia within normal limits.  Vaginal mucosa pink, moist, normal rugae.  Nonfriable cervix without lesions, no discharge or bleeding noted on speculum exam.  Bimanual exam revealed normal, nongravid uterus.  No cervical motion tenderness. No adnexal masses bilaterally.   Ext: WWP, no edema MSK: No obvious deformities, gait intact Neuro: Alert and oriented, speech normal       Chemistry      Component Value Date/Time   NA 138 12/19/2016 1605   K 4.0 12/19/2016 1605   CL 105 12/19/2016 1605   CO2 23 12/19/2016 1605   BUN 8 12/19/2016 1605   CREATININE 0.83 12/19/2016  1605      Component Value Date/Time   CALCIUM 9.4 12/19/2016 1605   ALKPHOS 132 (H) 12/19/2016 1605   AST 15 12/19/2016 1605   ALT 12 12/19/2016 1605   BILITOT 0.8 12/19/2016 1605      Lab Results  Component Value Date   WBC 8.0 12/19/2016   HGB 14.9 12/19/2016   HCT 44.3 12/19/2016   MCV 99.8 12/19/2016   PLT 253 12/19/2016   Lab Results  Component Value Date   TSH 2.19 12/19/2016   No results found for: HGBA1C Assessment & Plan:     Tonya Scott is a 31 y.o. female here for   Bacterial vaginosis Clue cells and positive whiff test on wet prep Informed patient Treat with Flagyl BID x7d  Goiter History of goiter with normal TSH and normal radioactive iodine scan Recheck TSH - normal Reassured patient that no further testing is indicated at this time Could consider yearly TSH screening  Screen for STD (sexually transmitted disease) Discussed safe sex practices Check HIV, RPR, GC chlamydia, wet prep Also check Pap smear as patient is due for Pap smear  Fatigue Likely multifactorial Discussed sleep hygiene with the patient Check TSH as above Also check CMP and CBC   Patient also asking at the end of the visit if she might have sleep apnea Advised her to make a follow-up appointment so that we can give this appropriate  evaluation  Erasmo Downer, MD MPH PGY-3,  [facility] 12/20/2016  2:36 PM

## 2016-12-20 DIAGNOSIS — N760 Acute vaginitis: Secondary | ICD-10-CM

## 2016-12-20 DIAGNOSIS — B9689 Other specified bacterial agents as the cause of diseases classified elsewhere: Secondary | ICD-10-CM | POA: Insufficient documentation

## 2016-12-20 LAB — HIV ANTIBODY (ROUTINE TESTING W REFLEX): HIV: NONREACTIVE

## 2016-12-20 LAB — RPR

## 2016-12-20 MED ORDER — METRONIDAZOLE 500 MG PO TABS: 500.0000 mg | ORAL_TABLET | Freq: Two times a day (BID) | ORAL | 0 refills | Status: DC

## 2016-12-20 NOTE — Assessment & Plan Note (Signed)
Discussed safe sex practices Check HIV, RPR, GC chlamydia, wet prep Also check Pap smear as patient is due for Pap smear

## 2016-12-20 NOTE — Assessment & Plan Note (Signed)
History of goiter with normal TSH and normal radioactive iodine scan Recheck TSH - normal Reassured patient that no further testing is indicated at this time Could consider yearly TSH screening

## 2016-12-20 NOTE — Assessment & Plan Note (Signed)
Likely multifactorial Discussed sleep hygiene with the patient Check TSH as above Also check CMP and CBC

## 2016-12-20 NOTE — Assessment & Plan Note (Signed)
Clue cells and positive whiff test on wet prep Informed patient Treat with Flagyl BID x7d

## 2016-12-21 ENCOUNTER — Telehealth: Payer: Self-pay | Admitting: Family Medicine

## 2016-12-21 LAB — CYTOLOGY - PAP
ADEQUACY: ABSENT
DIAGNOSIS: NEGATIVE

## 2016-12-21 LAB — CERVICOVAGINAL ANCILLARY ONLY
Chlamydia: NEGATIVE
Neisseria Gonorrhea: NEGATIVE

## 2016-12-21 NOTE — Telephone Encounter (Signed)
Was called yesterday. She was told her lab results yesterday so she cannot figure out why someone else called her. Please advise

## 2016-12-24 ENCOUNTER — Other Ambulatory Visit: Payer: Self-pay | Admitting: Family Medicine

## 2016-12-24 ENCOUNTER — Encounter: Payer: Self-pay | Admitting: Family Medicine

## 2016-12-24 MED ORDER — METRONIDAZOLE 500 MG PO TABS: 500.0000 mg | ORAL_TABLET | Freq: Two times a day (BID) | ORAL | 0 refills | Status: DC

## 2017-01-03 ENCOUNTER — Ambulatory Visit: Payer: Medicare Other | Admitting: Family Medicine

## 2017-01-16 ENCOUNTER — Encounter: Payer: Self-pay | Admitting: Family Medicine

## 2017-01-30 NOTE — Progress Notes (Deleted)
   Subjective:   Tonya Scott is a 31 y.o. female with a history of *** here for ***  *** Patient is concerned that she may have sleep apnea  Review of Systems:  Per HPI.   Social History: *** smoker  Objective:  There were no vitals taken for this visit.  Gen:  31 y.o. female in NAD *** HEENT: NCAT, MMM, EOMI, PERRL, anicteric sclerae CV: RRR, no MRG, no JVD Resp: Non-labored, CTAB, no wheezes noted Abd: Soft, NTND, BS present, no guarding or organomegaly Ext: WWP, no edema MSK: Full ROM, strength intact Neuro: Alert and oriented, speech normal       Chemistry      Component Value Date/Time   NA 138 12/19/2016 1605   K 4.0 12/19/2016 1605   CL 105 12/19/2016 1605   CO2 23 12/19/2016 1605   BUN 8 12/19/2016 1605   CREATININE 0.83 12/19/2016 1605      Component Value Date/Time   CALCIUM 9.4 12/19/2016 1605   ALKPHOS 132 (H) 12/19/2016 1605   AST 15 12/19/2016 1605   ALT 12 12/19/2016 1605   BILITOT 0.8 12/19/2016 1605      Lab Results  Component Value Date   WBC 8.0 12/19/2016   HGB 14.9 12/19/2016   HCT 44.3 12/19/2016   MCV 99.8 12/19/2016   PLT 253 12/19/2016   Lab Results  Component Value Date   TSH 2.19 12/19/2016   No results found for: HGBA1C Assessment & Plan:     Tonya Scott is a 31 y.o. female here for ***  No problem-specific Assessment & Plan notes found for this encounter.     Erasmo Downer, MD MPH PGY-3,  [facility] 01/30/2017  2:03 PM

## 2017-01-31 ENCOUNTER — Ambulatory Visit: Payer: Medicare Other | Admitting: Family Medicine

## 2017-02-05 ENCOUNTER — Ambulatory Visit: Payer: Medicare Other | Admitting: Family Medicine

## 2017-05-31 ENCOUNTER — Ambulatory Visit: Payer: Medicare Other | Admitting: Internal Medicine

## 2017-06-28 ENCOUNTER — Ambulatory Visit: Payer: Medicare Other | Admitting: Internal Medicine

## 2018-07-04 ENCOUNTER — Encounter: Payer: Medicare Other | Admitting: Family Medicine

## 2018-10-29 NOTE — L&D Delivery Note (Addendum)
OB/GYN Faculty Practice Delivery Note  Tonya Scott is a 33 y.o. now [id number] s/p NSVD at [redacted]w[redacted]d who was admitted for active labor and advanced cervical dilation.   ROM: 0h 64m with clear fluid GBS Status: Negative Maximum Maternal Temperature: 98.2  Delivery Note At 2:01 PM a viable female was delivered via Vaginal, Spontaneous (Presentation: ROA).  APGAR: 8, 9; weight 7 lbs 6 oz.   Placenta status: spontaneous via Delena Bali, intact.  Cord: 3 VC with no complications.  Cord pH: n/a  Anesthesia: epidural  Episiotomy: None Lacerations: None Suture Repair: none Est. Blood Loss (mL):  0   Postpartum Planning [x]  Mom to postpartum.  Baby to Couplet care / Skin to Skin.  [x]  plans to bottle feed [x]  message to sent to schedule follow-up  [x]  vaccines UTD - declined flu  Laury Deep, MSN, CNM 09/02/19, 2:47 PM

## 2019-02-10 ENCOUNTER — Other Ambulatory Visit (HOSPITAL_COMMUNITY)
Admission: RE | Admit: 2019-02-10 | Discharge: 2019-02-10 | Disposition: A | Discharge status: Home / Self Care | Payer: Medicare Other | Source: Ambulatory Visit | Attending: Family Medicine | Admitting: Family Medicine

## 2019-02-10 ENCOUNTER — Other Ambulatory Visit: Payer: Self-pay

## 2019-02-10 ENCOUNTER — Ambulatory Visit (INDEPENDENT_AMBULATORY_CARE_PROVIDER_SITE_OTHER): Payer: Medicare Other | Admitting: Family Medicine

## 2019-02-10 VITALS — BP 102/58 | HR 82 | Temp 98.7°F | Wt 186.1 lb

## 2019-02-10 DIAGNOSIS — N3000 Acute cystitis without hematuria: Secondary | ICD-10-CM

## 2019-02-10 DIAGNOSIS — Z3200 Encounter for pregnancy test, result unknown: Secondary | ICD-10-CM

## 2019-02-10 DIAGNOSIS — Z3A01 Less than 8 weeks gestation of pregnancy: Secondary | ICD-10-CM

## 2019-02-10 LAB — POCT WET PREP (WET MOUNT)
Clue Cells Wet Prep Whiff POC: NEGATIVE
Trichomonas Wet Prep HPF POC: ABSENT

## 2019-02-10 LAB — POCT URINE PREGNANCY: Preg Test, Ur: POSITIVE — AB

## 2019-02-10 NOTE — Progress Notes (Signed)
URINE   

## 2019-02-10 NOTE — Patient Instructions (Signed)
Pregnancy and Sexually Transmitted Infections  An STI (sexually transmitted infection) is a disease or infection that may be passed (transmitted) from person to person, usually during sexual activity. This may happen by way of saliva, semen, blood, vaginal mucus, or urine. An STI can be caused by bacteria, viruses, or parasites.  During pregnancy, STIs can be dangerous for you and your unborn baby. It is important to take steps to reduce your chances of getting an STI. Also, you need to be seen by your health care provider right away if you think you may have an STI, or if you think you may have been exposed to an STI. Diagnosis and treatment will depend on the type of STI.  If you are already pregnant, you will be screened for HIV (human immunodeficiency virus) early in your pregnancy. If you are at high risk for HIV, this test may be repeated during your third trimester of pregnancy.  What are some common STIs?  There are different types of STIs. Some STIs that cause problems in pregnancy include:   Gonorrhea.   Chlamydia.   Syphilis.   HIV and AIDS (acquired immunodeficiency syndrome).   Genital herpes.   Hepatitis.   Genital warts.   Human papillomavirus (HPV).   Trichomoniasis.  STIs that do not affect the baby include:   Chancroid.   Pubic lice.  What are the possible effects of STIs during pregnancy?  STIs can cause:   Stillbirth.   Miscarriage.   Premature labor.   Premature rupture of the membranes.   Serious birth defects or deformities.   Infection of the amniotic sac.   Infections that occur after birth (postpartum) in you and the baby.   Slowed growth of the baby before birth.   Illnesses in newborns.  What are common symptoms of STIs?  Different STIs have different symptoms. Some women may not have any symptoms. If symptoms are present, they may include:   Painful or bloody urination.   Pain in the pelvis, abdomen, vagina, anus, throat, or eyes.   A skin rash, itching, or  irritation.   Growths, ulcerations, blisters, or sores in the genital and anal areas.   Fever.   Abnormal vaginal discharge, with or without bad odor.   Pain or bleeding during sexual intercourse.   Yellowing of the skin and the white parts of the eyes (jaundice).   Swollen glands in the groin area.  Even if symptoms are not present, an STI can still be passed to another person during sexual contact.  How are STIs diagnosed?  Your health care provider can use tests to determine if you have an STI. These may include blood tests, urine tests, and tests performed during a pelvic exam. You should be screened for STIs, including gonorrhea and chlamydia, if:   You are sexually active and are younger than age 24.   You are age 24 or older and your health care provider tells you that you are at risk for these types of infections.   Your sexual activity has changed since the last time you were screened, and you are at an increased risk for chlamydia or gonorrhea. Ask your health care provider if you are at risk.  How can I reduce my risk of getting an STI?  Take these actions to reduce your risk of getting an STI:   Do not have any oral, vaginal, or anal sex. This is known as practicing abstinence.   If you have sex, use a latex   condom or a female condom consistently and correctly during sexual intercourse.   Use dental dams and water-soluble lubricants during sexual activity. Do not use petroleum jelly or oils.   Avoid having multiple sexual partners.   Do not have sex with someone who has other sexual partners.   Do not have sex with anyone you do not know or who is at high risk for an STI.   Avoid risky sex acts that can break the skin.   Do not have sex if you have open sores on your mouth or skin.   Avoid engaging in oral and anal sex acts.   Get the hepatitis vaccine. It is safe for pregnant women.    What should I do if I think I have an STI?   See your health care provider.   Tell your sexual  partner or partners. They should be tested and treated for any STIs.   Do not have sex until your health care provider says it is okay.  Get help right away if:  Contact your health care provider right away if:   You have any symptoms of an STI.   You think that you have, or your sexual partner has, an STI even if there are no symptoms.   You think that you may have been exposed to an STI.  This information is not intended to replace advice given to you by your health care provider. Make sure you discuss any questions you have with your health care provider.  Document Released: 11/22/2004 Document Revised: 06/14/2016 Document Reviewed: 05/21/2016  Elsevier Interactive Patient Education  2019 [employer] Inc[employer]

## 2019-02-10 NOTE — Progress Notes (Signed)
Acute Office Visit  Subjective:    Patient ID: Tonya Scott, female    DOB: 04-25-1986, 33 y.o.   MRN: [id number]  Chief Complaint  Patient presents with  . Possible Pregnancy    Patient feels fatigued.   Patient's last menstrual period was 12/01/2018.   Vaginal Discharge  The patient's primary symptoms include a genital odor, missed menses and vaginal discharge. The patient's pertinent negatives include no genital itching, genital lesions, genital rash, pelvic pain or vaginal bleeding. This is a new problem. The current episode started today. The problem occurs constantly. The problem has been unchanged. The patient is experiencing no pain. She is pregnant. Associated symptoms include anorexia, back pain, constipation, frequency and nausea. Pertinent negatives include no abdominal pain, chills, diarrhea, discolored urine, dysuria, fever, flank pain, headaches, hematuria, joint pain, joint swelling, painful intercourse, rash, sore throat, urgency or vomiting. The vaginal discharge was white. There has been no bleeding. She has not been passing clots. She has not been passing tissue. Nothing aggravates the symptoms. She has tried nothing for the symptoms. The treatment provided no relief. She is sexually active. No, her partner does not have an STD. She uses nothing for contraception. Her menstrual history has been irregular. Her past medical history is significant for an STD, a terminated pregnancy and vaginosis. There is no history of an abdominal surgery, a Cesarean section, an ectopic pregnancy, endometriosis, a gynecological surgery, herpes simplex, menorrhagia, metrorrhagia, miscarriage, ovarian cysts, perineal abscess or PID.     History reviewed. No pertinent past medical history.  Past Surgical History:  Procedure Laterality Date  . iimplanon  03/15/2011    History reviewed. No pertinent family history.  Social History   Socioeconomic History  . Marital status: Single   Spouse name: Not on file  . Number of children: Not on file  . Years of education: Not on file  . Highest education level: Not on file  Occupational History  . Not on file  Social Needs  . Financial resource strain: Not on file  . Food insecurity:    Worry: Not on file    Inability: Not on file  . Transportation needs:    Medical: Not on file    Non-medical: Not on file  Tobacco Use  . Smoking status: Current Every Day Smoker    Packs/day: 0.20    Years: 5.00    Pack years: 1.00    Types: Cigarettes  . Smokeless tobacco: Never Used  Substance and Sexual Activity  . Alcohol use: Yes    Alcohol/week: 1.0 standard drinks    Types: 1 drink(s) per week  . Drug use: No  . Sexual activity: Not on file  Lifestyle  . Physical activity:    Days per week: Not on file    Minutes per session: Not on file  . Stress: Not on file  Relationships  . Social connections:    Talks on phone: Not on file    Gets together: Not on file    Attends religious service: Not on file    Active member of club or organization: Not on file    Attends meetings of clubs or organizations: Not on file    Relationship status: Not on file  . Intimate partner violence:    Fear of current or ex partner: Not on file    Emotionally abused: Not on file    Physically abused: Not on file    Forced sexual activity: Not on file  Other  Topics Concern  . Not on file  Social History Narrative  . Not on file    Outpatient Medications Prior to Visit  Medication Sig Dispense Refill  . metroNIDAZOLE (FLAGYL) 500 MG tablet Take 1 tablet (500 mg total) by mouth 2 (two) times daily. 14 tablet 0   No facility-administered medications prior to visit.     No Known Allergies  Review of Systems  Constitutional: Negative for chills and fever.  HENT: Negative for sore throat.   Gastrointestinal: Positive for anorexia, constipation and nausea. Negative for abdominal pain, diarrhea and vomiting.  Genitourinary: Positive  for frequency, missed menses and vaginal discharge. Negative for dysuria, flank pain, hematuria, menorrhagia, pelvic pain and urgency.  Musculoskeletal: Positive for back pain. Negative for joint pain.  Skin: Negative for rash.  Neurological: Negative for headaches.       Objective:    Physical Exam  Constitutional: She appears well-developed and well-nourished. No distress.  HENT:  Head: Normocephalic and atraumatic.  Eyes: Right eye exhibits no discharge. Left eye exhibits no discharge. No scleral icterus.  Neck: No JVD present.  Cardiovascular: Normal rate and regular rhythm.  Pulmonary/Chest: Effort normal and breath sounds normal. No respiratory distress.  Abdominal: Soft. She exhibits no distension. There is no abdominal tenderness.  Musculoskeletal: Normal range of motion.        General: No edema.  Neurological: She is alert. She exhibits normal muscle tone.  Skin: Skin is warm and dry. She is not diaphoretic.  Psychiatric: She has a normal mood and affect. Her behavior is normal.    BP (!) 102/58   Pulse 82   Temp 98.7 F (37.1 C) (Oral)   Wt 186 lb 2 oz (84.4 kg)   LMP 12/01/2018   SpO2 98%   BMI 29.15 kg/m  Wt Readings from Last 3 Encounters:  02/10/19 186 lb 2 oz (84.4 kg)  12/19/16 208 lb (94.3 kg)  07/25/12 230 lb 9.6 oz (104.6 kg)   Urine Preg is posisitive  There are no preventive care reminders to display for this patient.  There are no preventive care reminders to display for this patient.   Lab Results  Component Value Date   TSH 2.19 12/19/2016   Lab Results  Component Value Date   WBC 8.0 12/19/2016   HGB 14.9 12/19/2016   HCT 44.3 12/19/2016   MCV 99.8 12/19/2016   PLT 253 12/19/2016   Lab Results  Component Value Date   NA 138 12/19/2016   K 4.0 12/19/2016   CO2 23 12/19/2016   GLUCOSE 81 12/19/2016   BUN 8 12/19/2016   CREATININE 0.83 12/19/2016   BILITOT 0.8 12/19/2016   ALKPHOS 132 (H) 12/19/2016   AST 15 12/19/2016   ALT  12 12/19/2016   PROT 7.3 12/19/2016   ALBUMIN 4.2 12/19/2016   CALCIUM 9.4 12/19/2016   No results found for: CHOL No results found for: HDL No results found for: LDLCALC No results found for: TRIG No results found for: CHOLHDL No results found for: [id number]     Assessment & Plan:   Problem List Items Addressed This Visit      Other   Less than [redacted] weeks gestation of pregnancy    Vaginal discharge likely physiologic in setting of new pregnancy. Wet prep negative. GC/Chlmydia pending. Patient intends to keep pregnancy. Wants initial OB visit in 1 week.  - OB panel (includes HIV/RPR for STD testing) - OB Urine Cx - HgG Frac  Relevant Orders   Obstetric Panel, Including HIV([facility])   HGB FRAC. W/SOLUBILITY    Other Visit Diagnoses    Possible pregnancy    -  Primary   Relevant Orders   POCT urine pregnancy (Completed)   POCT Wet Prep [facility]) (Completed)   Cervicovaginal ancillary only   Culture, OB Urine       No orders of the defined types were placed in this encounter.    Garnette Gunner, MD

## 2019-02-10 NOTE — Assessment & Plan Note (Signed)
Vaginal discharge likely physiologic in setting of new pregnancy. Wet prep negative. GC/Chlmydia pending. Patient intends to keep pregnancy. Wants initial OB visit in 1 week.  - OB panel (includes HIV/RPR for STD testing) - OB Urine Cx - HgG Frac

## 2019-02-11 LAB — CERVICOVAGINAL ANCILLARY ONLY
Chlamydia: NEGATIVE
Neisseria Gonorrhea: NEGATIVE

## 2019-02-12 ENCOUNTER — Encounter: Payer: Self-pay | Admitting: Family Medicine

## 2019-02-12 ENCOUNTER — Telehealth: Payer: Self-pay | Admitting: Family Medicine

## 2019-02-12 LAB — OBSTETRIC PANEL, INCLUDING HIV
Antibody Screen: NEGATIVE
Basophils Absolute: 0 10*3/uL (ref 0.0–0.2)
Basos: 1 %
EOS (ABSOLUTE): 0 10*3/uL (ref 0.0–0.4)
Eos: 1 %
HIV Screen 4th Generation wRfx: NONREACTIVE
Hematocrit: 33 % — ABNORMAL LOW (ref 34.0–46.6)
Hemoglobin: 11.7 g/dL (ref 11.1–15.9)
Hepatitis B Surface Ag: NEGATIVE
Immature Grans (Abs): 0 10*3/uL (ref 0.0–0.1)
Immature Granulocytes: 0 %
Lymphocytes Absolute: 1.5 10*3/uL (ref 0.7–3.1)
Lymphs: 24 %
MCH: 34.1 pg — ABNORMAL HIGH (ref 26.6–33.0)
MCHC: 35.5 g/dL (ref 31.5–35.7)
MCV: 96 fL (ref 79–97)
Monocytes Absolute: 0.4 10*3/uL (ref 0.1–0.9)
Monocytes: 6 %
Neutrophils Absolute: 4.2 10*3/uL (ref 1.4–7.0)
Neutrophils: 68 %
Platelets: 215 10*3/uL (ref 150–450)
RBC: 3.43 x10E6/uL — ABNORMAL LOW (ref 3.77–5.28)
RDW: 11.5 % — ABNORMAL LOW (ref 11.7–15.4)
RPR Ser Ql: NONREACTIVE
Rh Factor: POSITIVE
Rubella Antibodies, IGG: 6.07 index (ref >0.99)
WBC: 6.1 10*3/uL (ref 3.4–10.8)

## 2019-02-12 LAB — HGB FRAC. W/SOLUBILITY
Hgb A2 Quant: 4 % — ABNORMAL HIGH (ref 1.8–3.2)
Hgb A: 55.5 % — ABNORMAL LOW (ref 96.4–98.8)
Hgb C: 0 %
Hgb F Quant: 0 % (ref 0.0–2.0)
Hgb S: 40.5 % — ABNORMAL HIGH
Hgb Solubility: POSITIVE — AB
Hgb Variant: 0 %

## 2019-02-12 NOTE — Telephone Encounter (Signed)
Pt called stating that she went to pharmacy for her prescription on metroNIDAZOLE, and that pharmacy asked for an RX number. Please give patient a call back.

## 2019-02-13 LAB — CULTURE, OB URINE

## 2019-02-13 LAB — URINE CULTURE, OB REFLEX

## 2019-02-14 MED ORDER — CEPHALEXIN 250 MG PO CAPS: 250.0000 mg | ORAL_CAPSULE | Freq: Four times a day (QID) | ORAL | 0 refills | Status: AC

## 2019-02-14 NOTE — Addendum Note (Signed)
Addended by: Garnette Gunner on: 02/14/2019 08:49 PM   Modules accepted: Orders

## 2019-02-14 NOTE — Progress Notes (Signed)
Pt urine culture positive for E coli. Will treat with Keflex. Recommend Repeat UCx at next apt. Will forward to PCP. Will have staff call to update her.

## 2019-02-16 NOTE — Progress Notes (Signed)
Informed pt of Rx of Keflex was ready for pick. So that means pt has not started medication yet. I informed pt that she has an appt on 02/19/2019 at 10:15 and that a repeat UCx would be done. Aquilla Solian, CMA

## 2019-02-19 ENCOUNTER — Ambulatory Visit (INDEPENDENT_AMBULATORY_CARE_PROVIDER_SITE_OTHER): Payer: Medicare Other | Admitting: Family Medicine

## 2019-02-19 ENCOUNTER — Other Ambulatory Visit: Payer: Self-pay

## 2019-02-19 VITALS — BP 104/64 | HR 107 | Temp 98.7°F | Wt 186.0 lb

## 2019-02-19 DIAGNOSIS — Z3A01 Less than 8 weeks gestation of pregnancy: Secondary | ICD-10-CM

## 2019-02-19 DIAGNOSIS — Z3A10 10 weeks gestation of pregnancy: Secondary | ICD-10-CM

## 2019-02-19 NOTE — Patient Instructions (Addendum)
It was great to see you today. It sounds like everything is going well so far. We took blood for genetic testing today and you will need a dating ultrasound.  Please come back for your next appointment in four weeks.

## 2019-02-19 NOTE — Progress Notes (Signed)
Tonya Scott is a 33 y.o. yo No obstetric history on file. at Unknown who presents for her initial prenatal visit. Pregnancy is not planned.  It is been 8 years since her previous pregnancy.  She is excited about her pregnancy and plans to carry it out. She reports breast tenderness, fatigue and positive home pregnancy test.  She is specifically concerned about breast itching and possible boils on her right breast. She  is taking PNV.  She currently smokes 3 cigarettes/day, a significant decrease from her usual, she is aware that smoking cessation is very important pregnancy. See flow sheet for details.  PMH, POBH, FH, meds, allergies and Social Hx reviewed.  Prenatal Exam: Gen: Well nourished, well developed.  No distress.  Vitals noted. HEENT: Normocephalic, atraumatic.  Neck supple without cervical lymphadenopathy, thyromegaly or thyroid nodules.  Fair dentition. CV: RRR no murmur, gallops or rubs Lungs: CTAB.  Normal respiratory effort without wheezes or rales. Breasts: No erythema or moisture noted in folds beneath breasts.  Right breast is notable for 2 erythematous boils.  One located in the medial aspect of her right breast the other located several centimeters superior.  Both of these lesions are about 1/2 cm in circumference, erythematous, nontender to palpation, without purulence. Abd: soft, NTND. +BS.  Uterus not appreciated above pelvis. Ext: No clubbing, cyanosis or edema. Psych: Normal grooming and dress.  Not depressed or anxious appearing.  Normal thought content and process without flight of ideas or looseness of associations.  Assessment & Plan:  33 y.o. yo [id number], at [redacted]w[redacted]d via LMP (12/01/2018) doing well.    Smoker: currently smokes 3 cigarettes/day. It was explained to her why this is important. She acknowledged understanding and will continue to decrease her habit.  Dating is not reliable. Pt estimating LMP. Dating U/S ordered.  HgbS, she is aware that she has sickle  cell trait. She is unsure if the father has sickle cell trait.  She was advised to have the father tested to better estimate the risk of the fetus.  Genetic screening offered: She would like early testing - nuchal translucency ordered.  Risk factors for GDM: african [place], obese. Early glucola is indicated. 1 hour GTT at follow up visit.  Boils on Right breast: Pt currently taking keflex 500 mg QID for 7 days for UTI.  This is also appropriate for cellulitis.  Pt encouraged to call or return to clinic if lesions worsen or become uncomfortable.  Prenatal labs reviewed, notable for HgbS. Initial labs showed 10,00-25,000 CFUs E. coli. Prescribed keflex. Not technically sufficient to treat as asymptomatic bacteriuria. Will not perform TOC.  Bleeding and pain precautions reviewed. Importance of prenatal vitamins reviewed.  Follow up in 4 weeks.

## 2019-02-26 ENCOUNTER — Ambulatory Visit (HOSPITAL_COMMUNITY)
Admission: RE | Admit: 2019-02-26 | Discharge: 2019-02-26 | Disposition: A | Discharge status: Home / Self Care | Payer: Medicare Other | Source: Ambulatory Visit | Attending: Family Medicine | Admitting: Family Medicine

## 2019-02-26 ENCOUNTER — Other Ambulatory Visit: Payer: Self-pay

## 2019-02-26 DIAGNOSIS — Z3A10 10 weeks gestation of pregnancy: Secondary | ICD-10-CM | POA: Diagnosis present

## 2019-03-09 ENCOUNTER — Encounter: Payer: Self-pay | Admitting: Family Medicine

## 2019-03-19 ENCOUNTER — Encounter: Payer: Self-pay | Admitting: Family Medicine

## 2019-03-19 ENCOUNTER — Ambulatory Visit (INDEPENDENT_AMBULATORY_CARE_PROVIDER_SITE_OTHER): Payer: Medicare Other | Admitting: Family Medicine

## 2019-03-19 ENCOUNTER — Other Ambulatory Visit: Payer: Self-pay

## 2019-03-19 VITALS — BP 110/64 | HR 98 | Wt 192.0 lb

## 2019-03-19 DIAGNOSIS — Z3492 Encounter for supervision of normal pregnancy, unspecified, second trimester: Secondary | ICD-10-CM

## 2019-03-19 DIAGNOSIS — F17200 Nicotine dependence, unspecified, uncomplicated: Secondary | ICD-10-CM

## 2019-03-19 LAB — POCT 1 HR PRENATAL GLUCOSE: Glucose 1 Hr Prenatal, POC: 121 mg/dL

## 2019-03-19 MED ORDER — PYRIDOXINE HCL 25 MG PO TABS: 25.0000 mg | ORAL_TABLET | Freq: Every day | ORAL | 0 refills | Status: DC

## 2019-03-19 MED ORDER — NICOTINE 7 MG/24HR TD PT24: 7.0000 mg | MEDICATED_PATCH | Freq: Every day | TRANSDERMAL | 2 refills | Status: DC

## 2019-03-19 NOTE — Patient Instructions (Signed)
It was great to see you today.  Sounds like your pregnancy has been going well so far.  He was quick summary of the things we talked about:  Smoking-continue to try to cut back.  I will prescribe you a nicotine patch that he can change daily.  Mild nausea/vomiting-I prescribed you a vitamin (B6).  Sounds like your nausea is quite mild but feel free to take this if you find it helpful.  Headaches-continue taking Tylenol  Genetic screening-we will do blood test today.  I will follow-up with you.  Anatomy ultrasound-we will schedule an anatomy ultrasound for you to have done between 18 and 20 weeks.

## 2019-03-19 NOTE — Progress Notes (Signed)
Tonya Scott is a 33 y.o. [id number] at [redacted]w[redacted]d here for routine follow up.  She reports headache, nausea, no bleeding and no contractions. She specifically denies vaginal bleeding, gush of fluid, vaginal discharge, consistent contractions.  She notes that she has had headaches with some frequency for the past week or 2.  She describes the headaches as a pulsing tightness on both sides of her head that gets mild relief from Tylenol.  She reports that she has not been taking any NSAIDs.  She also reports that she has been having some's stretching pain that goes along her left side and radiates to her middle back.  She notices this pain mostly at night when lying down on her left side.  Does not particularly irritated with walking she notices that it feels improved with her pregnancy pillow.  She also reports the boils on her breasts are significantly improved from the previous visit.  Continues to smoke about 3 cigarettes a day (down from half a pack prior to pregnancy)  General: Alert and cooperative and appears to be in no acute distress Cardio: Normal S1 and S2, no S3 or S4. Rhythm is regular. No murmurs or rubs.   Pulm: Clear to auscultation bilaterally, no crackles, wheezing. Extremities: Warm, well-perfused.  Trace lower extremity edema. Abdomen: Soft, nontender to palpation.  Fetal heart rate recorded under flowsheets.    A/P: Pregnancy at [redacted]w[redacted]d.  Doing well.    Nicotine use-now down to 3 cigarettes daily. -Start nicotine patch 7 mg daily, possible to escalate or change to an alternative medication.  Patient was made aware of potential risks of the nicotine affecting the fetus all these risks are likely lessen the risk of continued smoking during pregnancy.  Hemoglobin S-unsure of partners blood type.  Genetic screening-nuchal translucency ordered previously was not followed up. -Quad screen performed today  Negative for GDM-African-American, obese. -1 hour GTT normal  today  Headaches-blood pressure normal today.  No right upper quadrant pain or vision changes.  Low suspicion for preeclampsia.  Likely tension headache worsened by the significant reduction in nicotine.  We will continue to treat with Tylenol.  May improve with nicotine patches.  Routine prenatal care -Anatomy ultrasound ordered to be scheduled at 18-19 weeks. -Pyridoxine ordered for nausea  Bleeding and pain precautions reviewed. Follow up 4 weeks.

## 2019-04-01 ENCOUNTER — Other Ambulatory Visit: Payer: Self-pay | Admitting: Family Medicine

## 2019-04-08 ENCOUNTER — Telehealth: Payer: Self-pay

## 2019-04-08 NOTE — Telephone Encounter (Signed)
Patient calls nurse line stating the pharmacy does not have B6. Patient is wanting something else called in that medicaid will cover.

## 2019-04-12 ENCOUNTER — Other Ambulatory Visit: Payer: Self-pay | Admitting: Obstetrics & Gynecology

## 2019-04-12 MED ORDER — PROMETHAZINE HCL 25 MG PO TABS: 25.0000 mg | ORAL_TABLET | Freq: Four times a day (QID) | ORAL | 1 refills | Status: DC | PRN

## 2019-04-13 ENCOUNTER — Ambulatory Visit (HOSPITAL_COMMUNITY)
Admission: RE | Admit: 2019-04-13 | Discharge: 2019-04-13 | Disposition: A | Discharge status: Home / Self Care | Payer: Medicare Other | Source: Ambulatory Visit | Attending: Obstetrics and Gynecology | Admitting: Obstetrics and Gynecology

## 2019-04-14 ENCOUNTER — Other Ambulatory Visit: Payer: Self-pay | Admitting: Family Medicine

## 2019-04-14 DIAGNOSIS — Z3492 Encounter for supervision of normal pregnancy, unspecified, second trimester: Secondary | ICD-10-CM

## 2019-04-14 MED ORDER — DOXYLAMINE-PYRIDOXINE 10-10 MG PO TBEC: 10.0000 mg | DELAYED_RELEASE_TABLET | Freq: Two times a day (BID) | ORAL | 0 refills | Status: DC | PRN

## 2019-04-14 NOTE — Telephone Encounter (Signed)
Diclegis Rx sent to pharmacy and [place].  She is welcome to try that but Epic doesn't seem to think that it will be cover either.  She can always try vitamin B6 over the counter. It looks like it's $8-12/bottle. The dose for nausea in pregnancy is 10-25 mg.  If she asked the pharmacist, I would think that she could be directed to whatever might be available in the store.  Matilde Haymaker, MD

## 2019-04-15 ENCOUNTER — Ambulatory Visit (HOSPITAL_COMMUNITY)
Admission: RE | Admit: 2019-04-15 | Discharge: 2019-04-15 | Disposition: A | Discharge status: Home / Self Care | Payer: Medicare Other | Source: Ambulatory Visit | Attending: Obstetrics and Gynecology | Admitting: Obstetrics and Gynecology

## 2019-04-15 ENCOUNTER — Other Ambulatory Visit: Payer: Self-pay | Admitting: Family Medicine

## 2019-04-15 ENCOUNTER — Other Ambulatory Visit: Payer: Self-pay

## 2019-04-15 DIAGNOSIS — Z862 Personal history of diseases of the blood and blood-forming organs and certain disorders involving the immune mechanism: Secondary | ICD-10-CM | POA: Diagnosis not present

## 2019-04-15 DIAGNOSIS — Z3A19 19 weeks gestation of pregnancy: Secondary | ICD-10-CM

## 2019-04-15 DIAGNOSIS — O289 Unspecified abnormal findings on antenatal screening of mother: Secondary | ICD-10-CM | POA: Diagnosis not present

## 2019-04-15 DIAGNOSIS — Z3492 Encounter for supervision of normal pregnancy, unspecified, second trimester: Secondary | ICD-10-CM

## 2019-04-15 DIAGNOSIS — Z363 Encounter for antenatal screening for malformations: Secondary | ICD-10-CM

## 2019-04-15 DIAGNOSIS — O99332 Smoking (tobacco) complicating pregnancy, second trimester: Secondary | ICD-10-CM

## 2019-04-15 NOTE — Telephone Encounter (Signed)
[facility] message sent to patient to update on medication change.  Camerin Ladouceur,CMA

## 2019-04-16 ENCOUNTER — Encounter: Payer: Medicare Other | Admitting: Family Medicine

## 2019-04-16 ENCOUNTER — Other Ambulatory Visit (HOSPITAL_COMMUNITY): Payer: Self-pay | Admitting: *Deleted

## 2019-04-16 DIAGNOSIS — O359XX0 Maternal care for (suspected) fetal abnormality and damage, unspecified, not applicable or unspecified: Secondary | ICD-10-CM

## 2019-04-20 ENCOUNTER — Encounter: Payer: Medicare Other | Admitting: Family Medicine

## 2019-04-20 ENCOUNTER — Telehealth: Payer: Self-pay | Admitting: Family Medicine

## 2019-04-20 NOTE — Telephone Encounter (Signed)
Opened in error

## 2019-04-20 NOTE — Progress Notes (Deleted)
  Patient Name: Tonya Scott Date of Birth: [birth year] Date of Visit: 04/20/19 PCP: Nuala Alpha, DO  Chief Complaint: prenatal care  Subjective: [name] is a pleasant [phone] at  *** dated by *** consistent with ***. She has no unusual complaints today. Reports good fetal movement. Denies contractions, loss of fluid, or bleeding.    ROS:  ROS  I have reviewed the patient's medical, surgical, family, and social history as appropriate.   Pertinent PMH: ***  Prior Obstetric History: ***  Complications of Current Pregnancy:***   There were no vitals filed for this visit.  Pregravid weight not on file Could not be calculated There were no vitals filed for this visit.    There are no diagnoses linked to this encounter.  Anticipatory Guidance and Prenatal Education provided on the following topics: - Preterm labor signs *** - Reasons to present to MAU - Nutrition in pregnancy - Contraception postpartum - Breastfeed - Safe sleep for infant   Darrelyn Hillock, [place] PGY-1

## 2019-04-20 NOTE — Telephone Encounter (Signed)
Call patient due to missed prenatal visit this morning.  She will be calling the clinic to reschedule.  Patriciaann Clan, DO

## 2019-04-24 ENCOUNTER — Ambulatory Visit (INDEPENDENT_AMBULATORY_CARE_PROVIDER_SITE_OTHER): Payer: Medicare Other | Admitting: Family Medicine

## 2019-04-24 ENCOUNTER — Encounter: Payer: Self-pay | Admitting: Family Medicine

## 2019-04-24 ENCOUNTER — Other Ambulatory Visit: Payer: Self-pay

## 2019-04-24 DIAGNOSIS — Z3492 Encounter for supervision of normal pregnancy, unspecified, second trimester: Secondary | ICD-10-CM

## 2019-04-24 DIAGNOSIS — Z3490 Encounter for supervision of normal pregnancy, unspecified, unspecified trimester: Secondary | ICD-10-CM | POA: Insufficient documentation

## 2019-04-24 DIAGNOSIS — F17200 Nicotine dependence, unspecified, uncomplicated: Secondary | ICD-10-CM

## 2019-04-24 MED ORDER — DOXYLAMINE-PYRIDOXINE 10-10 MG PO TBEC: 10.0000 mg | DELAYED_RELEASE_TABLET | Freq: Two times a day (BID) | ORAL | 0 refills | Status: DC | PRN

## 2019-04-24 MED ORDER — NICOTINE 7 MG/24HR TD PT24: 7.0000 mg | MEDICATED_PATCH | Freq: Every day | TRANSDERMAL | 2 refills | Status: DC

## 2019-04-24 MED ORDER — PRENATAL ADULT GUMMY/DHA/FA 0.4-25 MG PO CHEW: 1.0000 | CHEWABLE_TABLET | Freq: Every day | ORAL | 2 refills | Status: DC

## 2019-04-24 NOTE — Progress Notes (Signed)
Tonya Scott is a 33 y.o. [id number] at [redacted]w[redacted]d here for routine follow up.  She reports no vaginal bleeding, no big gush of fluid, good fetal movement, mild headaches.  She reports that she felt very stressed regarding the recent ultrasound which she read through her my chart and was under the impression that the fetus is small (the 53rd percentile).  Due to her stress regarding the ultrasound, she has stopped smoking and has not been smoking for the past 24 hours.  See flow sheet for details.  We spoke at length about her last ultrasound which showed a normal fetus, without abnormality or growth restriction.  General: Alert and cooperative and appears to be in no acute distress Cardio: Regular rate and rhythm Pulm: Normal respiratory effort on room air. Abdomen: Difficult to palpate fundus due to habitus though it was mildly palpable directly below the umbilicus. Extremities: No peripheral edema on exam today.   A/P: Pregnancy at [redacted]w[redacted]d.  Doing well.   Nicotine use -No cigarettes in the past 24 hours -She is encouraged to continue with abstinence -She was encouraged to use nicotine patches if she is not able to continue her cessation -Nicotine patch as ordered  Hgb S  - will attempt to have father tested - Message sent to Neoma Laming regarding an encounter for the father the baby  Genetic screen - low risk   Routine care -Anatomy scan reviewed, problems are not noted. OB recommending repeat U/S in 4 weeks (05/13/2019) -B6 scented -Prenatal vitamins prescribed  Preterm labor precautions reviewed. Follow up 4 weeks.

## 2019-04-24 NOTE — Patient Instructions (Signed)
It was great to see you today!  I'm glad we could straighten out some of your concerns about baby's growth and your vitamins.  Come back in 4 weeks  Your next U/S is scheduled for 7/15.  We will be doing some additional tests next time (glucose tolerance test)

## 2019-05-13 ENCOUNTER — Encounter (HOSPITAL_COMMUNITY): Payer: Self-pay

## 2019-05-13 ENCOUNTER — Ambulatory Visit (HOSPITAL_COMMUNITY)
Admission: RE | Admit: 2019-05-13 | Discharge: 2019-05-13 | Disposition: A | Discharge status: Home / Self Care | Payer: Medicare Other | Source: Ambulatory Visit | Attending: Obstetrics and Gynecology | Admitting: Obstetrics and Gynecology

## 2019-05-13 ENCOUNTER — Ambulatory Visit (HOSPITAL_COMMUNITY): Payer: Medicare Other

## 2019-05-19 ENCOUNTER — Encounter (HOSPITAL_COMMUNITY): Payer: Self-pay

## 2019-05-19 ENCOUNTER — Ambulatory Visit (HOSPITAL_COMMUNITY)
Admission: RE | Admit: 2019-05-19 | Discharge: 2019-05-19 | Disposition: A | Discharge status: Home / Self Care | Payer: Medicare Other | Source: Ambulatory Visit | Attending: Maternal & Fetal Medicine | Admitting: Maternal & Fetal Medicine

## 2019-05-19 ENCOUNTER — Ambulatory Visit (HOSPITAL_COMMUNITY): Payer: Medicare Other

## 2019-05-22 ENCOUNTER — Encounter: Payer: Medicare Other | Admitting: Family Medicine

## 2019-05-28 ENCOUNTER — Other Ambulatory Visit: Payer: Self-pay

## 2019-05-28 ENCOUNTER — Ambulatory Visit (HOSPITAL_COMMUNITY)
Admission: RE | Admit: 2019-05-28 | Discharge: 2019-05-28 | Disposition: A | Discharge status: Home / Self Care | Payer: Medicare Other | Source: Ambulatory Visit | Attending: Maternal & Fetal Medicine | Admitting: Maternal & Fetal Medicine

## 2019-05-28 ENCOUNTER — Encounter (HOSPITAL_COMMUNITY): Payer: Self-pay

## 2019-05-28 ENCOUNTER — Ambulatory Visit (HOSPITAL_COMMUNITY): Payer: Medicare Other | Admitting: *Deleted

## 2019-05-28 VITALS — BP 122/72 | HR 120 | Temp 98.8°F

## 2019-05-28 DIAGNOSIS — O99019 Anemia complicating pregnancy, unspecified trimester: Secondary | ICD-10-CM | POA: Diagnosis present

## 2019-05-28 DIAGNOSIS — Z362 Encounter for other antenatal screening follow-up: Secondary | ICD-10-CM | POA: Diagnosis not present

## 2019-05-28 DIAGNOSIS — O359XX0 Maternal care for (suspected) fetal abnormality and damage, unspecified, not applicable or unspecified: Secondary | ICD-10-CM | POA: Insufficient documentation

## 2019-05-28 DIAGNOSIS — O289 Unspecified abnormal findings on antenatal screening of mother: Secondary | ICD-10-CM | POA: Diagnosis not present

## 2019-05-28 DIAGNOSIS — O99332 Smoking (tobacco) complicating pregnancy, second trimester: Secondary | ICD-10-CM

## 2019-05-28 DIAGNOSIS — D573 Sickle-cell trait: Secondary | ICD-10-CM | POA: Diagnosis present

## 2019-05-28 DIAGNOSIS — Z3A25 25 weeks gestation of pregnancy: Secondary | ICD-10-CM

## 2019-06-09 ENCOUNTER — Encounter: Payer: Medicare Other | Admitting: Family Medicine

## 2019-06-26 ENCOUNTER — Encounter: Payer: Medicare Other | Admitting: Family Medicine

## 2019-07-08 ENCOUNTER — Other Ambulatory Visit: Payer: Self-pay

## 2019-07-08 ENCOUNTER — Other Ambulatory Visit: Payer: Self-pay | Admitting: Family Medicine

## 2019-07-08 ENCOUNTER — Ambulatory Visit (INDEPENDENT_AMBULATORY_CARE_PROVIDER_SITE_OTHER): Payer: Medicare Other | Admitting: Family Medicine

## 2019-07-08 VITALS — BP 128/62 | HR 105 | Wt 207.0 lb

## 2019-07-08 DIAGNOSIS — Z23 Encounter for immunization: Secondary | ICD-10-CM | POA: Diagnosis not present

## 2019-07-08 DIAGNOSIS — Z3493 Encounter for supervision of normal pregnancy, unspecified, third trimester: Secondary | ICD-10-CM

## 2019-07-08 MED ORDER — PRENATAL 19 29-1 MG PO TABS: 1.0000 | ORAL_TABLET | Freq: Every day | ORAL | 1 refills | Status: DC

## 2019-07-08 NOTE — Patient Instructions (Signed)

## 2019-07-08 NOTE — Progress Notes (Signed)
Tonya Scott is a 33 y.o. [id number] at [redacted]w[redacted]d here for routine follow up.  She reports no vaginal bleeding, no loss of fluid, and no lower abdominal or pelvic pain. Not sure if she is having braxon-hicks contractions but maybe. She is having low back pain radiating to her left leg.  She continues to smoke, down to 2 cigarettes per day. See flow sheet for details.  A/P: Pregnancy at [redacted]w[redacted]d.  Doing well.   FHR: 130-138 FH: 32cm  Pregnancy issues include back pain and leg pain making it hard to sleep. Given stretches for sciatica. Tylenol as needed.  Infant feeding choice: undecided Contraception choice: Tubal Infant circumcision desired: yes  Tdap was given today. Declined Flu shot.  Preterm labor and fetal movement precautions reviewed. Safe sleep discussed. Follow up 2 weeks.

## 2019-07-13 ENCOUNTER — Encounter: Payer: Self-pay | Admitting: Family Medicine

## 2019-07-17 ENCOUNTER — Other Ambulatory Visit: Payer: Self-pay | Admitting: Family Medicine

## 2019-07-17 ENCOUNTER — Telehealth: Payer: Self-pay | Admitting: *Deleted

## 2019-07-17 DIAGNOSIS — Z3493 Encounter for supervision of normal pregnancy, unspecified, third trimester: Secondary | ICD-10-CM

## 2019-07-17 NOTE — Telephone Encounter (Addendum)
Pt informed and scheduled for OB clinic in OCT. Will be sending name of prenatals that is covered by her insurance through [facility]. Please be on the lookout. Delray Alt, CMA  ----- Message from Nuala Alpha, DO sent at 07/17/2019  1:52 PM EDT ----- Regarding: OB [facility] [place] Team!  Can we call this patient and let her know we need to schedule her for an appointment in our [facility]? Let her know there were some labs I needed to get at her last appointment that I missed and we will get those when she comes in. Thanks!  Tim ----- Message ----- From: Leeanne Rio, MD Sent: 07/16/2019   3:59 PM EDT To: Nuala Alpha, DO  Hey Tim, I looked over your recent OB note for this patient and identified a few things that are missing.  Items needing attention:  Please have patient schedule in OB clinic with myself or Dr. Owens Shark  Needs regular GTT - had early one done but was lost to follow up from [redacted]w[redacted]d to [redacted]w[redacted]d  Also past due for 28 week labs - HIV, CBC, RPR  Needs PHQ-9 and pregnancy medical home forms completed (typically done at 28w visit)  Let me know if you have questions. Thanks!  [name]

## 2019-07-17 NOTE — Progress Notes (Signed)
These labs were missed on patients last visit to our clinic with me. Adding in and scheduling her for [facility] clinic with Dr. McIntrye/Dr. Owens Shark

## 2019-07-21 NOTE — Telephone Encounter (Signed)
Error. Deseree Blount, CMA  

## 2019-07-22 ENCOUNTER — Encounter: Payer: Medicare Other | Admitting: Family Medicine

## 2019-08-04 ENCOUNTER — Ambulatory Visit (INDEPENDENT_AMBULATORY_CARE_PROVIDER_SITE_OTHER): Payer: Medicare Other | Admitting: Family Medicine

## 2019-08-04 ENCOUNTER — Other Ambulatory Visit (HOSPITAL_COMMUNITY)
Admission: RE | Admit: 2019-08-04 | Discharge: 2019-08-04 | Disposition: A | Discharge status: Home / Self Care | Payer: Medicare Other | Source: Ambulatory Visit | Attending: Family Medicine | Admitting: Family Medicine

## 2019-08-04 ENCOUNTER — Other Ambulatory Visit: Payer: Self-pay

## 2019-08-04 VITALS — BP 122/60 | HR 105 | Wt 207.0 lb

## 2019-08-04 DIAGNOSIS — O99820 Streptococcus B carrier state complicating pregnancy: Secondary | ICD-10-CM

## 2019-08-04 DIAGNOSIS — R102 Pelvic and perineal pain unspecified side: Secondary | ICD-10-CM

## 2019-08-04 DIAGNOSIS — Z3493 Encounter for supervision of normal pregnancy, unspecified, third trimester: Secondary | ICD-10-CM | POA: Insufficient documentation

## 2019-08-04 DIAGNOSIS — Z3A35 35 weeks gestation of pregnancy: Secondary | ICD-10-CM

## 2019-08-04 DIAGNOSIS — R8271 Bacteriuria: Secondary | ICD-10-CM

## 2019-08-04 DIAGNOSIS — O26893 Other specified pregnancy related conditions, third trimester: Secondary | ICD-10-CM

## 2019-08-04 LAB — POCT 1 HR PRENATAL GLUCOSE: Glucose 1 Hr Prenatal, POC: 181 mg/dL

## 2019-08-04 NOTE — Patient Instructions (Signed)
It was wonderful meeting you today.  We have gotten some labs today and the glucose test, I will let you know those results in the next few days.  You may purchase a " pregnancy abdominal binder" on [place] or for your local store to help keep your belly up.  If you have any vaginal bleeding, frequent contractions, leakage of fluid, or otherwise have any concerns--please make sure you follow-up in our clinic or go immediately [facility].   Absolutely make sure you follow-up on 10/15 for your prenatal visit.

## 2019-08-04 NOTE — Assessment & Plan Note (Addendum)
Fundal height and FHTs appropriate today.  Due for a 28-week labs, will have these collected.  Additionally had 1 hour Glucola test performed today as she was lost to follow-up from 20-31 weeks, failed with glucose 181.  - Scheduled for 3-hour GTT tomorrow, 10/7 - CBC, RPR, HIV - Repeat OB urine (had urine culture 25,000 colonies of E. coli earlier in pregnancy) - Follow-up already scheduled for 10/15 for prenatal care, discussed importance of maintaining this appointment - Reasons to present to the MAU were discussed

## 2019-08-04 NOTE — Assessment & Plan Note (Addendum)
2-week history of sharp "lightning bolt" sensation within her vaginal area upon certain movements.  Suspect likely from increased pelvic pressure from current third trimester pregnancy.  Reassuringly no concurrent alarm symptoms with appropriate FHTs in the office today.  Discussed trying abdominal binder to elevate abdomen to help try and relieve some pressure. - Abdominal binder - gc/ch  - Follow-up if not improving or sooner if worsening, discussed if develops any vaginal bleeding, leakage of fluid, consistent contractions to present to the MAU

## 2019-08-04 NOTE — Progress Notes (Signed)
   Subjective:    Patient ID: Tonya Scott, female    DOB: [birth year], 33 y.o.   MRN: [id number]   CC: " Pain between legs"   HPI: Tonya Scott is a 33 year old G72 P1021 at 102 w1d presenting discuss the following:  Vaginal pain, 3rd trimester: 2 week history of a sharp pain in between thighs, feels like the sharp pain is internal. Seems to notice it most when laying on her left side and when sitting up from a laying position.  Otherwise, does not seem to notice it.  Pain stops immediately once moved into better position, only lasting a few seconds. Didn't have with previous pregnancy. Having abdominal contractions very sparingly, 4 times randomly on different days, not consistent. Denies any vaginal bleeding, leakage of fluid, vaginal itching or irritation, dysuria, rash, lightheadedness/dizziness. Sexually active in monogamous relationship, no concern for STDs.  Fetus extremely active, moving frequently in her belly.  In addition, she was lost to follow-up from 20 weeks to 31 weeks, needs a 28-week labs, 1 hour glucose tolerance test, repeat OB culture that had 25,000 colonies of E. Coli.    Smoking status reviewed-current tobacco user, 2-3 cigarettes daily.  Significant cut back from 1-2 packs prior to pregnancy.  Review of Systems Per HPI   Objective:  BP 122/60   Pulse (!) 105   Wt 207 lb (93.9 kg)   LMP 12/01/2018 (Approximate)   BMI 32.42 kg/m  Vitals and nursing note reviewed Initial heart rate elevated, patient endorsed that she was extremely anxious coming into the office today d/t presenting complaint.  General: NAD, pleasant Cardiac: Tachycardic, regular rate and rhythm, 2/6 systolic murmur over pulmonic listening post Respiratory: CTAB, normal effort Abdomen: Gravid uterus to 35-36 cm, fetal heart tones 145 bpm. Extremities: no edema  Pelvic: Scant white vaginal discharge with no vaginal pooling.  Normal appearance of vulva, vagina, multi parous cervix. GC and chlamydia  collected.  Cervical exam: Thick and posterior without any cervical motion tenderness. Skin: warm and dry Psych: Anxious  Assessment & Plan:   Vaginal pain 2-week history of sharp "lightning bolt" sensation within her vaginal area upon certain movements.  Suspect likely from increased pelvic pressure from current third trimester pregnancy.  Reassuringly no concurrent alarm symptoms with appropriate FHTs in the office today.  Discussed trying abdominal binder to elevate abdomen to help try and relieve some pressure. - Abdominal binder - gc/ch  - Follow-up if not improving or sooner if worsening, discussed if develops any vaginal bleeding, leakage of fluid, consistent contractions to present to the MAU  Supervision of low-risk pregnancy Fundal height and FHTs appropriate today.  Due for a 28-week labs, will have these collected.  Additionally had 1 hour Glucola test performed today as she was lost to follow-up from 20-31 weeks, failed with glucose 181.  - Scheduled for 3-hour GTT tomorrow, 10/7 - CBC, RPR, HIV - Repeat OB urine (had urine culture 25,000 colonies of E. coli earlier in pregnancy) - Follow-up already scheduled for 10/15 for prenatal care, discussed importance of maintaining this appointment - Reasons to present to the MAU were discussed   Follow-up on 10/15, or sooner if needed.  [facility] Resident PGY-2

## 2019-08-05 ENCOUNTER — Other Ambulatory Visit: Payer: Self-pay | Admitting: Family Medicine

## 2019-08-05 ENCOUNTER — Other Ambulatory Visit: Payer: Medicare Other

## 2019-08-05 DIAGNOSIS — O99013 Anemia complicating pregnancy, third trimester: Secondary | ICD-10-CM

## 2019-08-05 LAB — CBC WITH DIFFERENTIAL/PLATELET
Basophils Absolute: 0 10*3/uL (ref 0.0–0.2)
Basos: 0 %
EOS (ABSOLUTE): 0.1 10*3/uL (ref 0.0–0.4)
Eos: 1 %
Hematocrit: 29.8 % — ABNORMAL LOW (ref 34.0–46.6)
Hemoglobin: 10.1 g/dL — ABNORMAL LOW (ref 11.1–15.9)
Immature Grans (Abs): 0.1 10*3/uL (ref 0.0–0.1)
Immature Granulocytes: 1 %
Lymphocytes Absolute: 1.9 10*3/uL (ref 0.7–3.1)
Lymphs: 20 %
MCH: 32.7 pg (ref 26.6–33.0)
MCHC: 33.9 g/dL (ref 31.5–35.7)
MCV: 96 fL (ref 79–97)
Monocytes Absolute: 0.4 10*3/uL (ref 0.1–0.9)
Monocytes: 4 %
Neutrophils Absolute: 7.1 10*3/uL — ABNORMAL HIGH (ref 1.4–7.0)
Neutrophils: 74 %
Platelets: 315 10*3/uL (ref 150–450)
RBC: 3.09 x10E6/uL — ABNORMAL LOW (ref 3.77–5.28)
RDW: 12.3 % (ref 11.7–15.4)
WBC: 9.6 10*3/uL (ref 3.4–10.8)

## 2019-08-05 LAB — RPR: RPR Ser Ql: NONREACTIVE

## 2019-08-05 LAB — HIV ANTIBODY (ROUTINE TESTING W REFLEX): HIV Screen 4th Generation wRfx: NONREACTIVE

## 2019-08-06 ENCOUNTER — Other Ambulatory Visit (INDEPENDENT_AMBULATORY_CARE_PROVIDER_SITE_OTHER): Payer: Medicare Other

## 2019-08-06 ENCOUNTER — Other Ambulatory Visit: Payer: Self-pay

## 2019-08-06 DIAGNOSIS — Z3493 Encounter for supervision of normal pregnancy, unspecified, third trimester: Secondary | ICD-10-CM

## 2019-08-06 LAB — CERVICOVAGINAL ANCILLARY ONLY
Chlamydia: NEGATIVE
Neisseria Gonorrhea: NEGATIVE

## 2019-08-06 LAB — POCT CBG (FASTING - GLUCOSE)-MANUAL ENTRY: Glucose Fasting, POC: 101 mg/dL — AB (ref 70–99)

## 2019-08-06 LAB — CULTURE, OB URINE

## 2019-08-06 LAB — URINE CULTURE, OB REFLEX

## 2019-08-07 LAB — CBC WITH DIFFERENTIAL/PLATELET
Basophils Absolute: 0 10*3/uL (ref 0.0–0.2)
Basos: 0 %
EOS (ABSOLUTE): 0.1 10*3/uL (ref 0.0–0.4)
Eos: 1 %
Hematocrit: 28.9 % — ABNORMAL LOW (ref 34.0–46.6)
Hemoglobin: 9.8 g/dL — ABNORMAL LOW (ref 11.1–15.9)
Immature Grans (Abs): 0.1 10*3/uL (ref 0.0–0.1)
Immature Granulocytes: 1 %
Lymphocytes Absolute: 2.6 10*3/uL (ref 0.7–3.1)
Lymphs: 23 %
MCH: 32.9 pg (ref 26.6–33.0)
MCHC: 33.9 g/dL (ref 31.5–35.7)
MCV: 97 fL (ref 79–97)
Monocytes Absolute: 0.6 10*3/uL (ref 0.1–0.9)
Monocytes: 5 %
Neutrophils Absolute: 8 10*3/uL — ABNORMAL HIGH (ref 1.4–7.0)
Neutrophils: 70 %
Platelets: 310 10*3/uL (ref 150–450)
RBC: 2.98 x10E6/uL — ABNORMAL LOW (ref 3.77–5.28)
RDW: 12.6 % (ref 11.7–15.4)
WBC: 11.4 10*3/uL — ABNORMAL HIGH (ref 3.4–10.8)

## 2019-08-07 LAB — GESTATIONAL GLUCOSE TOLERANCE
Glucose, Fasting: 85 mg/dL (ref 65–94)
Glucose, GTT - 1 Hour: 188 mg/dL — ABNORMAL HIGH (ref 65–179)
Glucose, GTT - 2 Hour: 121 mg/dL (ref 65–154)
Glucose, GTT - 3 Hour: 78 mg/dL (ref 65–139)

## 2019-08-07 LAB — FERRITIN: Ferritin: 163 ng/mL — ABNORMAL HIGH (ref 15–150)

## 2019-08-07 LAB — HIV ANTIBODY (ROUTINE TESTING W REFLEX): HIV Screen 4th Generation wRfx: NONREACTIVE

## 2019-08-07 LAB — RPR: RPR Ser Ql: NONREACTIVE

## 2019-08-07 LAB — SPECIMEN STATUS REPORT

## 2019-08-11 ENCOUNTER — Telehealth: Payer: Self-pay | Admitting: Family Medicine

## 2019-08-11 NOTE — Telephone Encounter (Signed)
Pt would like for someone to call her with her lab results from 10/08.   The best call back number is [phone].

## 2019-08-13 ENCOUNTER — Other Ambulatory Visit: Payer: Self-pay

## 2019-08-13 ENCOUNTER — Encounter: Payer: Self-pay | Admitting: Family Medicine

## 2019-08-13 ENCOUNTER — Ambulatory Visit (INDEPENDENT_AMBULATORY_CARE_PROVIDER_SITE_OTHER): Payer: Medicare Other | Admitting: Family Medicine

## 2019-08-13 VITALS — BP 121/75 | HR 116 | Temp 98.1°F | Wt 210.0 lb

## 2019-08-13 DIAGNOSIS — E079 Disorder of thyroid, unspecified: Secondary | ICD-10-CM

## 2019-08-13 DIAGNOSIS — Z3493 Encounter for supervision of normal pregnancy, unspecified, third trimester: Secondary | ICD-10-CM | POA: Diagnosis not present

## 2019-08-13 DIAGNOSIS — E049 Nontoxic goiter, unspecified: Secondary | ICD-10-CM | POA: Diagnosis not present

## 2019-08-13 DIAGNOSIS — F17200 Nicotine dependence, unspecified, uncomplicated: Secondary | ICD-10-CM

## 2019-08-13 NOTE — Telephone Encounter (Signed)
Attempted to reach pt x3 (first with best call back # below, stated was wrong number) yesterday and this morning, left voicemail. 10/8 labs were future labs placed after prenatal visit with Dr. Garlan Fillers in 05/2019, labs from 10/6 were released to [place] after visit (unfortunate duplicate).    If patient calls back to the clinic (left voicemail), let her know results as below, however she also has a prenatal appt today so they can also be discussed at that time.   RESULTS: HIV and RPR (syphillis) are negative, meaning NOT PRESENT. Additionally, her blood level (hemoglobin) is slightly low for her trimester, they will be getting some extra labs in her prenatal visit today 10/15, to make sure she doesn't have any underlying vitamin deficiencies.   Thank you so much! Best, Dr Higinio Plan

## 2019-08-13 NOTE — Progress Notes (Addendum)
  Patient Name: Tonya Scott Date of Birth: Oct 10, 1986 Date of Visit: 08/13/19 PCP: Nuala Alpha, DO  Chief Complaint: prenatal care  Subjective: Tonya Scott is a pleasant [phone] at [redacted]w[redacted]d dated by LMP consistent with last [place]. She has no unusual complaints today. Reports good fetal movement. Denies contractions, loss of fluid, or bleeding.   Patient is not interested in quitting smoking at this time.  She is really trying to cut down.  Patient has limited social support she lives with her brother and her-year-old son.  She has less pain than 33 years ago and reports she does not remember her self being this tired in her prior pregnancy.  She denies chest pain, bleeding, worsening headaches.  Fortunately is overall think she is doing okay.  ROS: See HPI  Patient's last menstrual period was 96/78/9381, she is certain of this.  This is 5 days out from a 13-week ultrasound.  We will keep her last menstrual period as her date the next she reports she even has it looked at her phone when her last menstrual period and the date of her intercourse was after this time.  I have reviewed the patient's medical, surgical, family, and social history as appropriate.   Pertinent PMH: Goiter related to thyroid disease in youth, current daily cigarette smoker  Prior Obstetric History: [id number], 2 elective abortions   Complications of Current Pregnancy: Sickle cell trait, tobacco use affecting pregnancy, scalp needle care  Vitals:   08/13/19 1118  BP: 121/75  Pulse: (!) 116  Temp: 98.1 F (36.7 C)   Last Weight  Most recent update: 08/13/2019 11:19 AM   Weight  95.3 kg (210 lb)          Maternal HR 102 on my exam  Fetal HR: 137  Fundal Height: 37 cm   Ultrasound: Vertex position  Tonya Scott was seen today for routine prenatal visit.  Patient with history of thyroid disease and was treated with radioactive iodine in her youth. Will obtain TSH.  She has anemia with an MCV of 97, concern  for macrocytic causes.  Ferritin was normal.  Discussed importance of taking prenatal vitamin. GBS culture obtained and sensitivities pending.  She has a history of swelling of her throat with penicillin she reports. . Patient had decreased to 2-3 cigarettes from 1 ppd. Smoking cessation counseling given: counseled patient on the dangers of tobacco use, advised patient to stop smoking for not only her health but the health of her baby and 32 yo daughter. Recommended patient have father of baby get tested for sickle cell trait.  She can bring him a new patient packet from our practice. No exposure to kittens/cats or cat litter.   Diagnoses and all orders for this visit:  Encounter for supervision of pregnancy in third trimester -     Vitamin B12 -     Folate -     Strep Gp B Culture+Rflx - PMH and PHQ form completed.   Thyroid disease -     TSH  Anticipatory Guidance and Prenatal Education provided on the following topics: - Preterm labor signs  - Reasons to present to MAU - Nutrition in pregnancy - Contraception postpartum - Breastfeed - Safe sleep for infant   Lyndee Hensen, DO PGY-1, [facility] 08/13/2019 12:48 PM    Tonya Singh, MD  Family Medicine Teaching Service

## 2019-08-13 NOTE — Patient Instructions (Signed)
It was wonderful to see you today.  Thank you for choosing [facility].   Please call [phone] with any questions about today's appointment.  Please be sure to schedule follow up at the front  desk before you leave today.   Dorris Singh, MD  Family Medicine     We will see you next week  Be sure to pick up your prenatal vitamins

## 2019-08-14 LAB — FOLATE: Folate: 8 ng/mL (ref >3.0)

## 2019-08-14 LAB — TSH: TSH: 0.951 u[IU]/mL (ref 0.450–4.500)

## 2019-08-14 LAB — VITAMIN B12: Vitamin B-12: 209 pg/mL — ABNORMAL LOW (ref 232–1245)

## 2019-08-17 ENCOUNTER — Telehealth: Payer: Self-pay | Admitting: Family Medicine

## 2019-08-17 LAB — STREP GP B CULTURE+RFLX: Strep Gp B Culture+Rflx: NEGATIVE

## 2019-08-17 NOTE — Telephone Encounter (Signed)
Attempted to call patient regarding labs. Unable to reach on both numbers listed in chart.   Vitamin B12 is low, which may be contributing to anemia. Recommend IM therapy at upcoming appointment with Dr. Ouida Sills. Recommend review of dietary history as well. She should continue her prenatal vitamin.   Group B strep still pending.   Routed to Dr. Ouida Sills who is seeing patient this week.   Dorris Singh, MD  Family Medicine Teaching Service

## 2019-08-20 ENCOUNTER — Encounter: Payer: Medicare Other | Admitting: Student in an Organized Health Care Education/Training Program

## 2019-08-28 ENCOUNTER — Other Ambulatory Visit: Payer: Self-pay

## 2019-08-28 ENCOUNTER — Ambulatory Visit (INDEPENDENT_AMBULATORY_CARE_PROVIDER_SITE_OTHER): Payer: Medicare Other | Admitting: Student in an Organized Health Care Education/Training Program

## 2019-08-28 VITALS — BP 120/60 | HR 104 | Wt 210.0 lb

## 2019-08-28 DIAGNOSIS — E538 Deficiency of other specified B group vitamins: Secondary | ICD-10-CM

## 2019-08-28 MED ORDER — CYANOCOBALAMIN 1000 MCG/ML IJ SOLN
1000.0000 ug | Freq: Once | INTRAMUSCULAR | Status: AC
  Administered 2019-08-28: 1000 ug via INTRAMUSCULAR

## 2019-08-28 NOTE — Patient Instructions (Addendum)
It was a pleasure to see you today!  To summarize our discussion for this visit:  Your pregnancy is progressing well.  I would like to give you a vitamin B12 shot today and then another 1 in a week at your return visit.  I recommend that you try cutting back on your smoking as much as possible.  Unfortunately, we cannot screen your partner here for sickle cell trait since he is not a patient.  If you would like him to get tested please recommend that he go to his primary care doctor.  Please return to our clinic to see me in 1 week.  Call the clinic at [phone] if your symptoms worsen or you have any concerns.   Thank you for allowing me to take part in your care,  Dr. Jamelle Rushing   Vaginal Delivery  Vaginal delivery means that you give birth by pushing your baby out of your birth canal (vagina). A team of health care providers will help you before, during, and after vaginal delivery. Birth experiences are unique for every woman and every pregnancy, and birth experiences vary depending on where you choose to give birth. What happens when I arrive at the birth center or hospital? Once you are in labor and have been admitted into the hospital or birth center, your health care provider may:  Review your pregnancy history and any concerns that you have.  Insert an IV into one of your veins. This may be used to give you fluids and medicines.  Check your blood pressure, pulse, temperature, and heart rate (vital signs).  Check whether your bag of water (amniotic sac) has broken (ruptured).  Talk with you about your birth plan and discuss pain control options. Monitoring Your health care provider may monitor your contractions (uterine monitoring) and your baby's heart rate (fetal monitoring). You may need to be monitored:  Often, but not continuously (intermittently).  All the time or for long periods at a time (continuously). Continuous monitoring may be needed if: ? You  are taking certain medicines, such as medicine to relieve pain or make your contractions stronger. ? You have pregnancy or labor complications. Monitoring may be done by:  Placing a special stethoscope or a handheld monitoring device on your abdomen to check your baby's heartbeat and to check for contractions.  Placing monitors on your abdomen (external monitors) to record your baby's heartbeat and the frequency and length of contractions.  Placing monitors inside your uterus through your vagina (internal monitors) to record your baby's heartbeat and the frequency, length, and strength of your contractions. Depending on the type of monitor, it may remain in your uterus or on your baby's head until birth.  Telemetry. This is a type of continuous monitoring that can be done with external or internal monitors. Instead of having to stay in bed, you are able to move around during telemetry. Physical exam Your health care provider may perform frequent physical exams. This may include:  Checking how and where your baby is positioned in your uterus.  Checking your cervix to determine: ? Whether it is thinning out (effacing). ? Whether it is opening up (dilating). What happens during labor and delivery?  Normal labor and delivery is divided into the following three stages: Stage 1  This is the longest stage of labor.  This stage can last for hours or days.  Throughout this stage, you will feel contractions. Contractions generally feel mild, infrequent, and irregular at first. They get stronger, more  frequent (about every 2-3 minutes), and more regular as you move through this stage.  This stage ends when your cervix is completely dilated to 4 inches (10 cm) and completely effaced. Stage 2  This stage starts once your cervix is completely effaced and dilated and lasts until the delivery of your baby.  This stage may last from 20 minutes to 2 hours.  This is the stage where you will feel an  urge to push your baby out of your vagina.  You may feel stretching and burning pain, especially when the widest part of your baby's head passes through the vaginal opening (crowning).  Once your baby is delivered, the umbilical cord will be clamped and cut. This usually occurs after waiting a period of 1-2 minutes after delivery.  Your baby will be placed on your bare chest (skin-to-skin contact) in an upright position and covered with a warm blanket. Watch your baby for feeding cues, like rooting or sucking, and help the baby to your breast for his or her first feeding. Stage 3  This stage starts immediately after the birth of your baby and ends after you deliver the placenta.  This stage may take anywhere from 5 to 30 minutes.  After your baby has been delivered, you will feel contractions as your body expels the placenta and your uterus contracts to control bleeding. What can I expect after labor and delivery?  After labor is over, you and your baby will be monitored closely until you are ready to go home to ensure that you are both healthy. Your health care team will teach you how to care for yourself and your baby.  You and your baby will stay in the same room (rooming in) during your hospital stay. This will encourage early bonding and successful breastfeeding.  You may continue to receive fluids and medicines through an IV.  Your uterus will be checked and massaged regularly (fundal massage).  You will have some soreness and pain in your abdomen, vagina, and the area of skin between your vaginal opening and your anus (perineum).  If an incision was made near your vagina (episiotomy) or if you had some vaginal tearing during delivery, cold compresses may be placed on your episiotomy or your tear. This helps to reduce pain and swelling.  You may be given a squirt bottle to use instead of wiping when you go to the bathroom. To use the squirt bottle, follow these steps: ? Before you  urinate, fill the squirt bottle with warm water. Do not use hot water. ? After you urinate, while you are sitting on the toilet, use the squirt bottle to rinse the area around your urethra and vaginal opening. This rinses away any urine and blood. ? Fill the squirt bottle with clean water every time you use the bathroom.  It is normal to have vaginal bleeding after delivery. Wear a sanitary pad for vaginal bleeding and discharge. Summary  Vaginal delivery means that you will give birth by pushing your baby out of your birth canal (vagina).  Your health care provider may monitor your contractions (uterine monitoring) and your baby's heart rate (fetal monitoring).  Your health care provider may perform a physical exam.  Normal labor and delivery is divided into three stages.  After labor is over, you and your baby will be monitored closely until you are ready to go home. This information is not intended to replace advice given to you by your health care provider. Make sure you  discuss any questions you have with your health care provider. Document Released: 07/24/2008 Document Revised: 11/19/2017 Document Reviewed: 11/19/2017 Elsevier Patient Education  2020 [employer]

## 2019-08-28 NOTE — Progress Notes (Signed)
  Patient Name: Tonya Scott Date of Birth: [birth year] Date of Visit: 08/28/19 PCP: Nuala Alpha, DO  Chief Complaint: prenatal care  Subjective: [name] is a pleasant [phone] at  [redacted]w[redacted]d dated by LMP consistent with ultrasound at 13 weeks. She has no unusual complaints today. Reports good fetal movement. Denies contractions, loss of fluid, or bleeding.   Patient has been experiencing nausea throughout her pregnancy.  She did vomit this morning but feels well now and is able to tolerate a normal diet. Her weight is unchanged from visit last week.  Fundal height is measuring at 39 weeks and the heart rate 129.  Reports good fetal movement.  Endorses having contractions 3 times over the past 2 months.  Denies loss of fluid or bleeding.  Endorses having fatigue throughout the day.  Patient does not take her prenatal vitamin for the past 2 months and does not take iron supplement.  Patient is an everyday smoker with half a pack per day prior to pregnancy and has cut down to 3 cigarettes/day.  She has no interest in decreasing or quitting smoking at this time and declined any further resources.  Patient is craving ice  I have reviewed the patient's medical, surgical, family, and social history as appropriate.   Pertinent PMH: Sickle cell trait  Prior Obstetric History: Vaginal delivery 8 years ago without complication.  Complications of Current Pregnancy: Anemia, B12 deficiency, extended nausea  Objective:  Blood pressure 120/60, pulse (!) 104, weight 210 lb (95.3 kg), last menstrual period 12/01/2018.  Last weight 210 pounds.   [name] was seen today for routine prenatal visit.  Diagnoses and all orders for this visit:  B12 deficiency -     cyanocobalamin ((VITAMIN B-12)) injection 1,000 mcg  -Recommended continued prenatal vitamin and iron supplements. -Encourage patient to decrease and stop smoking.  Patient declined smoking cessation assistance. -Encourage patient to  have partner seek sickle cell screening. -Discussed with patient that at next visit she will be scheduled for BPP and discussed induction scheduling. -Return in 1 week.  Anticipatory Guidance and Prenatal Education provided on the following topics: - Preterm labor signs  - Reasons to present to MAU - Nutrition in pregnancy - Contraception postpartum - Breastfeed - Safe sleep for infant   Doristine Mango, DO Family Medicine Teaching Service

## 2019-09-01 ENCOUNTER — Telehealth: Payer: Self-pay | Admitting: Family Medicine

## 2019-09-01 ENCOUNTER — Encounter: Payer: Medicare Other | Admitting: Family Medicine

## 2019-09-01 NOTE — Telephone Encounter (Signed)
Patient no showed for her prenatal visit at 39 weeks. Attempted to reach patient via both phone numbers in her chart, stated call cannot be completed at this time.  Can we try to reach this patient through her additional contacts to have her reschedule?   Thank you!  Patriciaann Clan, DO

## 2019-09-01 NOTE — Telephone Encounter (Signed)
Disregard my last telephone note.  I see that she has already rescheduled her appointment for tomorrow, 11/4, with ATC.   Patriciaann Clan, DO

## 2019-09-01 NOTE — Progress Notes (Deleted)
  Patient Name: Maiyah Goyne Fung Date of Birth: 1986-04-02 Date of Visit: 09/01/19 PCP: Nuala Alpha, DO  Chief Complaint: prenatal care  Subjective: Ambyr Qadri Aytes is a pleasant [phone] at  *** dated by *** consistent with ***. She has no unusual complaints today. Reports good fetal movement. Denies contractions, loss of fluid, or bleeding.    ROS:  ROS  I have reviewed the patient's medical, surgical, family, and social history as appropriate.   Pertinent PMH: ***  Prior Obstetric History: ***  Complications of Current Pregnancy:***   There were no vitals filed for this visit.  186 lb (84.4 kg) 29.12 There were no vitals filed for this visit.    There are no diagnoses linked to this encounter.  Anticipatory Guidance and Prenatal Education provided on the following topics: - Preterm labor signs *** - Reasons to present to MAU - Nutrition in pregnancy - Contraception postpartum - Breastfeed - Safe sleep for infant   Dorris Singh, MD  [place] is a 33 y.o. G***P*** at [redacted]w[redacted]d for routine follow up.  She reports***.  See flow sheet for details.  A/P: Pregnancy at [redacted]w[redacted]d.  Doing well.   Pregnancy issues include***  Infant feeding choice*** Contraception choice*** Infant circumcision desired {Response; yes/no/na:63} GBS/GC/CZ results {were/were [id number]} reviewed today.   Labor precautions reviewed. Kick counts reviewed.

## 2019-09-02 ENCOUNTER — Other Ambulatory Visit: Payer: Self-pay

## 2019-09-02 ENCOUNTER — Encounter (HOSPITAL_COMMUNITY): Payer: Self-pay | Admitting: *Deleted

## 2019-09-02 ENCOUNTER — Inpatient Hospital Stay (HOSPITAL_COMMUNITY): Payer: Medicare Other | Admitting: Anesthesiology

## 2019-09-02 ENCOUNTER — Inpatient Hospital Stay (HOSPITAL_COMMUNITY)
Admission: AD | Admit: 2019-09-02 | Discharge: 2019-09-04 | DRG: 807 | Disposition: A | Discharge status: Home / Self Care | Payer: Medicare Other | Attending: Obstetrics and Gynecology | Admitting: Obstetrics and Gynecology

## 2019-09-02 DIAGNOSIS — Z20828 Contact with and (suspected) exposure to other viral communicable diseases: Secondary | ICD-10-CM | POA: Diagnosis present

## 2019-09-02 DIAGNOSIS — F1290 Cannabis use, unspecified, uncomplicated: Secondary | ICD-10-CM | POA: Diagnosis present

## 2019-09-02 DIAGNOSIS — Z3A39 39 weeks gestation of pregnancy: Secondary | ICD-10-CM | POA: Diagnosis not present

## 2019-09-02 DIAGNOSIS — Z30017 Encounter for initial prescription of implantable subdermal contraceptive: Secondary | ICD-10-CM | POA: Diagnosis not present

## 2019-09-02 DIAGNOSIS — O99324 Drug use complicating childbirth: Principal | ICD-10-CM | POA: Diagnosis present

## 2019-09-02 DIAGNOSIS — F17210 Nicotine dependence, cigarettes, uncomplicated: Secondary | ICD-10-CM | POA: Diagnosis present

## 2019-09-02 DIAGNOSIS — O99334 Smoking (tobacco) complicating childbirth: Secondary | ICD-10-CM | POA: Diagnosis present

## 2019-09-02 DIAGNOSIS — O26893 Other specified pregnancy related conditions, third trimester: Secondary | ICD-10-CM | POA: Diagnosis present

## 2019-09-02 HISTORY — DX: Anxiety disorder, unspecified: F41.9

## 2019-09-02 LAB — CBC
HCT: 31.2 % — ABNORMAL LOW (ref 36.0–46.0)
Hemoglobin: 10.7 g/dL — ABNORMAL LOW (ref 12.0–15.0)
MCH: 33.1 pg (ref 26.0–34.0)
MCHC: 34.3 g/dL (ref 30.0–36.0)
MCV: 96.6 fL (ref 80.0–100.0)
Platelets: 280 10*3/uL (ref 150–400)
RBC: 3.23 MIL/uL — ABNORMAL LOW (ref 3.87–5.11)
RDW: 12.9 % (ref 11.5–15.5)
WBC: 14.7 10*3/uL — ABNORMAL HIGH (ref 4.0–10.5)
nRBC: 0 % (ref 0.0–0.2)

## 2019-09-02 LAB — URINALYSIS, ROUTINE W REFLEX MICROSCOPIC
Bilirubin Urine: NEGATIVE
Glucose, UA: NEGATIVE mg/dL
Ketones, ur: NEGATIVE mg/dL
Nitrite: NEGATIVE
Protein, ur: NEGATIVE mg/dL
Specific Gravity, Urine: 1.006 (ref 1.005–1.030)
pH: 7 (ref 5.0–8.0)

## 2019-09-02 LAB — ABO/RH: ABO/RH(D): O POS

## 2019-09-02 LAB — RAPID URINE DRUG SCREEN, HOSP PERFORMED
Amphetamines: NOT DETECTED
Barbiturates: NOT DETECTED
Benzodiazepines: NOT DETECTED
Cocaine: NOT DETECTED
Opiates: NOT DETECTED
Tetrahydrocannabinol: POSITIVE — AB

## 2019-09-02 LAB — TYPE AND SCREEN
ABO/RH(D): O POS
Antibody Screen: NEGATIVE

## 2019-09-02 LAB — SARS CORONAVIRUS 2 BY RT PCR (HOSPITAL ORDER, PERFORMED IN ~~LOC~~ HOSPITAL LAB): SARS Coronavirus 2: NEGATIVE

## 2019-09-02 MED ORDER — FENTANYL-BUPIVACAINE-NACL 0.5-0.125-0.9 MG/250ML-% EP SOLN: 12.0000 mL/h | EPIDURAL | Status: DC | PRN

## 2019-09-02 MED ORDER — ACETAMINOPHEN 325 MG PO TABS
650.0000 mg | ORAL_TABLET | ORAL | Status: DC | PRN
  Administered 2019-09-02 – 2019-09-03 (×2): 650 mg via ORAL
  Filled 2019-09-02: qty 2

## 2019-09-02 MED ORDER — SIMETHICONE 80 MG PO CHEW: 80.0000 mg | CHEWABLE_TABLET | ORAL | Status: DC | PRN

## 2019-09-02 MED ORDER — DIPHENHYDRAMINE HCL 25 MG PO CAPS: 25.0000 mg | ORAL_CAPSULE | Freq: Four times a day (QID) | ORAL | Status: DC | PRN

## 2019-09-02 MED ORDER — LACTATED RINGERS IV SOLN
500.0000 mL | Freq: Once | INTRAVENOUS | Status: AC
  Administered 2019-09-02: 500 mL via INTRAVENOUS

## 2019-09-02 MED ORDER — DIPHENHYDRAMINE HCL 50 MG/ML IJ SOLN: 12.5000 mg | INTRAMUSCULAR | Status: DC | PRN

## 2019-09-02 MED ORDER — NICOTINE 7 MG/24HR TD PT24
7.0000 mg | MEDICATED_PATCH | Freq: Every day | TRANSDERMAL | Status: DC
  Filled 2019-09-02 (×3): qty 1

## 2019-09-02 MED ORDER — COCONUT OIL OIL: 1.0000 "application " | TOPICAL_OIL | Status: DC | PRN

## 2019-09-02 MED ORDER — ONDANSETRON HCL 4 MG PO TABS: 4.0000 mg | ORAL_TABLET | ORAL | Status: DC | PRN

## 2019-09-02 MED ORDER — LIDOCAINE HCL (PF) 1 % IJ SOLN: 30.0000 mL | INTRAMUSCULAR | Status: DC | PRN

## 2019-09-02 MED ORDER — LIDOCAINE HCL 1 % IJ SOLN
0.0000 mL | Freq: Once | INTRAMUSCULAR | Status: AC | PRN
  Administered 2019-09-02: 21:00:00 20 mL via INTRADERMAL
  Filled 2019-09-02: qty 20

## 2019-09-02 MED ORDER — FENTANYL-BUPIVACAINE-NACL 0.5-0.125-0.9 MG/250ML-% EP SOLN
EPIDURAL | Status: AC
  Filled 2019-09-02: qty 250

## 2019-09-02 MED ORDER — SENNOSIDES-DOCUSATE SODIUM 8.6-50 MG PO TABS
2.0000 | ORAL_TABLET | ORAL | Status: DC
  Administered 2019-09-03 (×2): 2 via ORAL
  Filled 2019-09-02 (×2): qty 2

## 2019-09-02 MED ORDER — ZOLPIDEM TARTRATE 5 MG PO TABS: 5.0000 mg | ORAL_TABLET | Freq: Every evening | ORAL | Status: DC | PRN

## 2019-09-02 MED ORDER — BENZOCAINE-MENTHOL 20-0.5 % EX AERO
1.0000 "application " | INHALATION_SPRAY | CUTANEOUS | Status: DC | PRN
  Administered 2019-09-02: 1 via TOPICAL
  Filled 2019-09-02: qty 56

## 2019-09-02 MED ORDER — LACTATED RINGERS IV SOLN
INTRAVENOUS | Status: DC
  Administered 2019-09-02: 12:00:00 via INTRAVENOUS

## 2019-09-02 MED ORDER — IBUPROFEN 600 MG PO TABS
600.0000 mg | ORAL_TABLET | Freq: Four times a day (QID) | ORAL | Status: DC
  Administered 2019-09-02 – 2019-09-04 (×8): 600 mg via ORAL
  Filled 2019-09-02 (×8): qty 1

## 2019-09-02 MED ORDER — ETONOGESTREL 68 MG ~~LOC~~ IMPL
68.0000 mg | DRUG_IMPLANT | Freq: Once | SUBCUTANEOUS | Status: AC
  Administered 2019-09-02: 21:00:00 68 mg via SUBCUTANEOUS
  Filled 2019-09-02: qty 1

## 2019-09-02 MED ORDER — WITCH HAZEL-GLYCERIN EX PADS: 1.0000 "application " | MEDICATED_PAD | CUTANEOUS | Status: DC | PRN

## 2019-09-02 MED ORDER — EPHEDRINE 5 MG/ML INJ: 10.0000 mg | INTRAVENOUS | Status: DC | PRN

## 2019-09-02 MED ORDER — SOD CITRATE-CITRIC ACID 500-334 MG/5ML PO SOLN: 30.0000 mL | ORAL | Status: DC | PRN

## 2019-09-02 MED ORDER — PRENATAL MULTIVITAMIN CH
1.0000 | ORAL_TABLET | Freq: Every day | ORAL | Status: DC
  Administered 2019-09-03 – 2019-09-04 (×2): 1 via ORAL
  Filled 2019-09-02 (×2): qty 1

## 2019-09-02 MED ORDER — SODIUM CHLORIDE (PF) 0.9 % IJ SOLN
INTRAMUSCULAR | Status: DC | PRN
  Administered 2019-09-02: 12 mL/h via EPIDURAL

## 2019-09-02 MED ORDER — LIDOCAINE HCL (PF) 1 % IJ SOLN
INTRAMUSCULAR | Status: DC | PRN
  Administered 2019-09-02 (×2): 5 mL via EPIDURAL

## 2019-09-02 MED ORDER — PHENYLEPHRINE 40 MCG/ML (10ML) SYRINGE FOR IV PUSH (FOR BLOOD PRESSURE SUPPORT): 80.0000 ug | PREFILLED_SYRINGE | INTRAVENOUS | Status: DC | PRN

## 2019-09-02 MED ORDER — OXYCODONE-ACETAMINOPHEN 5-325 MG PO TABS: 2.0000 | ORAL_TABLET | ORAL | Status: DC | PRN

## 2019-09-02 MED ORDER — FENTANYL CITRATE (PF) 100 MCG/2ML IJ SOLN
100.0000 ug | INTRAMUSCULAR | Status: DC | PRN
  Administered 2019-09-02: 100 ug via INTRAVENOUS

## 2019-09-02 MED ORDER — TETANUS-DIPHTH-ACELL PERTUSSIS 5-2.5-18.5 LF-MCG/0.5 IM SUSP: 0.5000 mL | Freq: Once | INTRAMUSCULAR | Status: DC

## 2019-09-02 MED ORDER — OXYTOCIN BOLUS FROM INFUSION
500.0000 mL | Freq: Once | INTRAVENOUS | Status: AC
  Administered 2019-09-02: 500 mL via INTRAVENOUS

## 2019-09-02 MED ORDER — OXYTOCIN 10 UNIT/ML IJ SOLN: 10.0000 [IU] | Freq: Once | INTRAMUSCULAR | Status: DC | PRN

## 2019-09-02 MED ORDER — OXYTOCIN 40 UNITS IN NORMAL SALINE INFUSION - SIMPLE MED
2.5000 [IU]/h | INTRAVENOUS | Status: DC
  Filled 2019-09-02: qty 1000

## 2019-09-02 MED ORDER — ONDANSETRON HCL 4 MG/2ML IJ SOLN: 4.0000 mg | INTRAMUSCULAR | Status: DC | PRN

## 2019-09-02 MED ORDER — DIBUCAINE (PERIANAL) 1 % EX OINT: 1.0000 "application " | TOPICAL_OINTMENT | CUTANEOUS | Status: DC | PRN

## 2019-09-02 MED ORDER — ACETAMINOPHEN 325 MG PO TABS: 650.0000 mg | ORAL_TABLET | ORAL | Status: DC | PRN

## 2019-09-02 MED ORDER — ONDANSETRON HCL 4 MG/2ML IJ SOLN: 4.0000 mg | Freq: Four times a day (QID) | INTRAMUSCULAR | Status: DC | PRN

## 2019-09-02 MED ORDER — LACTATED RINGERS IV SOLN: 500.0000 mL | INTRAVENOUS | Status: DC | PRN

## 2019-09-02 MED ORDER — OXYCODONE-ACETAMINOPHEN 5-325 MG PO TABS: 1.0000 | ORAL_TABLET | ORAL | Status: DC | PRN

## 2019-09-02 MED ORDER — FENTANYL CITRATE (PF) 100 MCG/2ML IJ SOLN
INTRAMUSCULAR | Status: AC
  Filled 2019-09-02: qty 2

## 2019-09-02 NOTE — Procedures (Signed)

## 2019-09-02 NOTE — Anesthesia Preprocedure Evaluation (Signed)
Anesthesia Evaluation  Patient identified by MRN, date of birth, ID band Patient awake    Reviewed: Allergy & Precautions, H&P , NPO status , Patient's Chart, lab work & pertinent test results  History of Anesthesia Complications Negative for: history of anesthetic complications  Airway Mallampati: II  TM Distance: >3 FB Neck ROM: full    Dental no notable dental hx. (+) Teeth Intact   Pulmonary neg pulmonary ROS, Current Smoker,    Pulmonary exam normal breath sounds clear to auscultation       Cardiovascular negative cardio ROS Normal cardiovascular exam Rhythm:regular Rate:Normal     Neuro/Psych negative neurological ROS  negative psych ROS   GI/Hepatic negative GI ROS, Neg liver ROS,   Endo/Other  negative endocrine ROS  Renal/GU negative Renal ROS  negative genitourinary   Musculoskeletal   Abdominal   Peds  Hematology negative hematology ROS (+)   Anesthesia Other Findings   Reproductive/Obstetrics (+) Pregnancy                             Anesthesia Physical Anesthesia Plan  ASA: II  Anesthesia Plan: Epidural   Post-op Pain Management:    Induction:   PONV Risk Score and Plan:   Airway Management Planned:   Additional Equipment:   Intra-op Plan:   Post-operative Plan:   Informed Consent: I have reviewed the patients History and Physical, chart, labs and discussed the procedure including the risks, benefits and alternatives for the proposed anesthesia with the patient or authorized representative who has indicated his/her understanding and acceptance.     Plan Discussed with:   Anesthesia Plan Comments:         Anesthesia Quick Evaluation  

## 2019-09-02 NOTE — MAU Note (Signed)
Pt presents EMS with c/o ctxs every 5 minutes that began @ 0500 this morning.  Denies LOF or VB.  Endorses +FM.

## 2019-09-02 NOTE — Progress Notes (Signed)
  [facility] [address] [place], [place]  [address] Phone:  [phone]  September 02, 2019     Patient: Tonya Scott  Date of Birth: 23-Dec-1985  Date of Visit: 09/02/2019                To Whom It May Concern: I am a nurse on labor and delivery at [facility]. Ms Sanor was brought to [place] by EMS in labor. She was 7 cm dilated upon arrival. She was pulled over for speeding and given a ticket by the officer. Please take this into consideration when reviewing her case.    Sincerely.  Ileana Ladd [facility] Bonnielee Haff RN Wilford Sports, RN  Delories Heinz, RN, MSN Specialty coordinator of labor and delivery  Mary Martinique Johnson RN Laury Deep CNM

## 2019-09-02 NOTE — Progress Notes (Signed)
Labor Progress Note Reigna Ruperto Terrance is a 33 y.o. [id number] at [redacted]w[redacted]d presented for uterine contractions. S: Called to room by patient's nurse. Dilation complete on exam. Patient requested AROM to progress labor.   O:  BP 123/73   Pulse (!) 122   Temp 98.2 F (36.8 C) (Oral)   Resp 20   Ht 5\' 8"  (1.727 m)   Wt 97.3 kg   LMP 12/01/2018 (Approximate)   SpO2 100%   BMI 32.63 kg/m  NST - FHR: 145 bpm / moderate variability / accels present / decels absent / TOCO: regular every 2-3 mins   CVE: Dilation: 10 Dilation Complete Date: 09/02/19 Dilation Complete Time: 1326 Effacement (%): 80, 90 Station: -3 Presentation: Vertex Exam by:: Colman Cater, CNM   A&P: 33 y.o. [id number] [redacted]w[redacted]d presented with spontaneous onset of labor. #Labor: Progressing well. #Pain: post epidural #FWB: Category 1 #GBS negative  Mercy Moore, Medical Student 1:55 PM

## 2019-09-02 NOTE — H&P (Addendum)
OBSTETRIC ADMISSION HISTORY AND PHYSICAL  Tonya Scott is a 33 y.o. female [phone] with IUP at [redacted]w[redacted]d by LMP and 13w [place] presenting with uterine contractions consistent with spontaneous onset of labor. She reports +FMs, No LOF, no VB, no blurry vision, headaches or peripheral edema, and RUQ pain. She plans on bottle feeding. She request inpatient Nexplanon insertion for birth control. She received her prenatal care at [facility]   Dating: By [place] at 13weeks --->  Estimated Date of Delivery: 09/07/19  Sono:    @[redacted]w[redacted]d , CWD, normal anatomy, cephalic presentation, anterior placental lie, 837g, 50% EFW   Prenatal History/Complications:  Past Medical History: Past Medical History:  Diagnosis Date  . Anxiety   . Tobacco abuse     Past Surgical History: Past Surgical History:  Procedure Laterality Date  . iimplanon  03/15/2011  . NO PAST SURGERIES      Obstetrical History: OB History    Gravida  2   Para  1   Term  1   Preterm  0   AB  0   Living  1     SAB  0   TAB      Ectopic      Multiple      Live Births  1           Social History Social History   Socioeconomic History  . Marital status: Single    Spouse name: Not on file  . Number of children: Not on file  . Years of education: Not on file  . Highest education level: Not on file  Occupational History  . Not on file  Social Needs  . Financial resource strain: Not on file  . Food insecurity    Worry: Not on file    Inability: Not on file  . Transportation needs    Medical: Not on file    Non-medical: Not on file  Tobacco Use  . Smoking status: Current Every Day Smoker    Packs/day: 0.20    Years: 5.00    Pack years: 1.00    Types: Cigarettes  . Smokeless tobacco: Never Used  Substance and Sexual Activity  . Alcohol use: Not Currently    Alcohol/week: 1.0 standard drinks    Types: 1 Standard drinks or equivalent per week  . Drug use: No  . Sexual activity: Yes    Partners: Male  Lifestyle   . Physical activity    Days per week: Not on file    Minutes per session: Not on file  . Stress: Not on file  Relationships  . Social Herbalist on phone: Not on file    Gets together: Not on file    Attends religious service: Not on file    Active member of club or organization: Not on file    Attends meetings of clubs or organizations: Not on file    Relationship status: Not on file  Other Topics Concern  . Not on file  Social History Narrative  . Not on file    Family History: Family History  Problem Relation Age of Onset  . Healthy Mother   . Healthy Father     Allergies: Allergies  Allergen Reactions  . Penicillins Other (See Comments)    Pt states she feels like her throat was closing    Medications Prior to Admission  Medication Sig Dispense Refill Last Dose  . Doxylamine-Pyridoxine 10-10 MG TBEC Take 10 mg by mouth 2 (two)  times daily as needed (can increase to every 8 hours as needed). (Patient not taking: Reported on 05/28/2019) 15 tablet 0   . metroNIDAZOLE (FLAGYL) 500 MG tablet Take 1 tablet (500 mg total) by mouth 2 (two) times daily. (Patient not taking: Reported on 05/28/2019) 14 tablet 0   . nicotine (EQ NICOTINE) 7 mg/24hr patch Place 1 patch (7 mg total) onto the skin daily. (Patient not taking: Reported on 05/28/2019) 28 patch 2   . Prenatal MV & Min w/FA-DHA (PRENATAL ADULT GUMMY/DHA/FA) 0.4-25 MG CHEW Chew 1 tablet by mouth daily. 60 tablet 2   . Prenatal Vit-Fe Fumarate-FA (MULTIVITAMIN-PRENATAL) 27-0.8 MG TABS tablet TAKE 1 TABLET BY MOUTH EVERY DAY 30 tablet 1   . pyridOXINE (VITAMIN B-6) 25 MG tablet Take 1 tablet (25 mg total) by mouth daily. (Patient not taking: Reported on 05/28/2019) 15 tablet 0      Review of Systems   All systems reviewed and negative except as stated in HPI  Blood pressure 137/88, pulse (!) 123, temperature 98.2 F (36.8 C), temperature source Oral, resp. rate 20, height 5\' 8"  (1.727 m), weight 97.3 kg, last  menstrual period 12/01/2018, SpO2 100 %. General appearance: alert, cooperative, appears stated age, mild distress and post spinal epidural Lungs: clear to auscultation bilaterally Heart: increased rate with regular rhythm Abdomen: soft, non-tender; bowel sounds normal Pelvic:  Extremities: no lower extremity edema, erythema or warmth  DTR's  Presentation: cephalic Fetal monitoringBaseline: 135 bpm, Variability: Good {> 6 bpm), Accelerations: Reactive and Decelerations: Absent Uterine activityDate/time of onset: 4a/9 hours ago, Frequency: Every 2-3 minutes and Duration: 60 seconds Dilation: 7 Effacement (%): 80, 90 Exam by:: Fabian NovemberM. Bhambri, CNM   Prenatal labs: ABO, Rh: --/--/O POS, O POS Performed at [facility], [address] [place], [place] [address]  [id number](11/04 1213) Antibody: NEG (11/04 1213) Rubella: 6.07 (04/14 1052) RPR: Non Reactive (10/08 1043)  HBsAg: Negative (04/14 1052)  HIV: Non Reactive (10/08 1043)  GBS: Negative/-- (10/15 1340)  1 hr Glucola elevated ; 3 hr Glucola normal Genetic screening  Quad Screen normal Anatomy US remarkable for "small stomach", but otherwise normal interval growth, with no evidence of structural fetal anomalies  Prenatal Transfer Tool  Maternal Diabetes: No Genetic Screening: Normal Maternal Ultrasounds/Referrals: Normal Fetal Ultrasounds or other Referrals:  None Maternal Substance Abuse:  Yes:  Type: Smoker, Marijuana Significant Maternal Medications:  None Significant Maternal Lab Results: None  Results for orders placed or performed during the hospital encounter of 09/02/19 (from the past 24 hour(s))  Urinalysis, Routine w reflex microscopic   Collection Time: 09/02/19 11:40 AM  Result Value Ref Range   Color, Urine YELLOW YELLOW   APPearance CLEAR CLEAR   Specific Gravity, Urine 1.006 1.005 - 1.030   pH 7.0 5.0 - 8.0   Glucose, UA NEGATIVE NEGATIVE mg/dL   Hgb urine dipstick SMALL (A) NEGATIVE   Bilirubin Urine NEGATIVE  NEGATIVE   Ketones, ur NEGATIVE NEGATIVE mg/dL   Protein, ur NEGATIVE NEGATIVE mg/dL   Nitrite NEGATIVE NEGATIVE   Leukocytes,Ua SMALL (A) NEGATIVE   RBC / HPF 0-5 0 - 5 RBC/hpf   WBC, UA 6-10 0 - 5 WBC/hpf   Bacteria, UA FEW (A) NONE SEEN   Squamous Epithelial / LPF 11-20 0 - 5  CBC   Collection Time: 09/02/19 12:09 PM  Result Value Ref Range   WBC 14.7 (H) 4.0 - 10.5 K/uL   RBC 3.23 (L) 3.87 - 5.11 MIL/uL   Hemoglobin 10.7 (  L) 12.0 - 15.0 g/dL   HCT 95.6 (L) 21.3 - 08.6 %   MCV 96.6 80.0 - 100.0 fL   MCH 33.1 26.0 - 34.0 pg   MCHC 34.3 30.0 - 36.0 g/dL   RDW 57.8 46.9 - 62.9 %   Platelets 280 150 - 400 K/uL   nRBC 0.0 0.0 - 0.2 %  Type and screen [facility]   Collection Time: 09/02/19 12:13 PM  Result Value Ref Range   ABO/RH(D) O POS    Antibody Screen NEG    Sample Expiration      09/05/2019,2359 Performed at [facility], [address] [place], [place] [address]   ABO/Rh   Collection Time: 09/02/19 12:13 PM  Result Value Ref Range   ABO/RH(D)      O POS Performed at [facility], [address], [place], [place] [address]     Patient Active Problem List   Diagnosis Date Noted  . Indication for care in labor or delivery 09/02/2019  . Supervision of low-risk pregnancy 04/24/2019  . TOBACCO USER 10/31/2009  . Goiter 10/03/2007    Assessment/Plan:  Tonya Scott is a 33 y.o. G2P1001 at [redacted]w[redacted]d here for spontaneous onset of labor starting ~9 hours ago.   #Labor: active labor, per SVE in MAU prior to arrival to the floor, 7 cm cervical dilation with 80-90% effacement; anticipate uncomplicated vaginal delivery  #Pain: post epidural  #FWB: Category 1  #ID: GBS- #MOF: bottle feeding #MOC: Nexplanon insertion  #Circ:  Plan for outpatient procedure due to cost  #Substance Use: patient divulges daily marijuana use and tobacco use (3 cigarettes/day); denies use of other alcohol or other illicit drugs -UDS pending  -plans to monitor  for development of withdrawal symptoms  Elveria Rising, Medical Student  09/02/2019, 1:23 PM   I confirm that I have verified the information documented in the medical student's note and that I have also personally reperformed the history, physical exam and all medical decision making activities of this service and have verified that all service and findings are accurately documented in this student's note.   Prepare for imminent vaginal delivery.  Raelyn Mora, CNM 09/02/2019 4:28 PM

## 2019-09-02 NOTE — Discharge Summary (Signed)
Postpartum Discharge Summary      Patient Name: Tonya Scott DOB: 1986-09-18 MRN: [id number]  Date of admission: 09/02/2019 Delivering Provider: Laury Deep   Date of discharge: 09/04/2019  Admitting diagnosis: EMS CTX Intrauterine pregnancy: [redacted]w[redacted]d    Secondary diagnosis:  Active Problems:   Indication for care in labor or delivery  Additional problems: none     Discharge diagnosis: Term Pregnancy Delivered                                                                                                Post partum procedures: Nexplanon placed  Augmentation: AROM  Complications: None  Hospital course:  Onset of Labor With Vaginal Delivery     33y.o. yo [id number] 362w2das admitted in Active Labor on 09/02/2019. Patient had an uncomplicated labor course as follows:  Membrane Rupture Time/Date: 1:55 PM ,09/02/2019   Intrapartum Procedures: Episiotomy: None [1]                                         Lacerations:  None [1]  Patient had a delivery of a Viable infant. 09/02/2019  Information for the patient's newborn:  Prickett, [id number]]Delivery Method: Vaginal, Spontaneous(Filed from Delivery Summary)     Pateint had an uncomplicated postpartum course.  She is ambulating, tolerating a regular diet, passing flatus, and urinating well. Patient is discharged home in stable condition on 09/04/19.  Delivery time: 2:01 PM    Magnesium Sulfate received: No BMZ received: No Rhophylac:No MMR:No Transfusion:No  Physical exam  Vitals:   09/03/19 0531 09/03/19 1610 09/03/19 2348 09/04/19 0522  BP: 109/75 109/70 113/65 106/70  Pulse: 78 83 82 82  Resp: 18 18 20 16   Temp: 97.7 F (36.5 C) 98.1 F (36.7 C) 98.1 F (36.7 C) 98.9 F (37.2 C)  TempSrc: Oral Oral Oral Oral  SpO2: 100%  99%   Weight:      Height:       General: alert, cooperative and no distress Lochia: appropriate Uterine Fundus: firm Incision: N/A DVT Evaluation: No evidence of DVT  seen on physical exam. Negative Homan's sign. No cords or calf tenderness. Labs: Lab Results  Component Value Date   WBC 14.7 (H) 09/02/2019   HGB 10.7 (L) 09/02/2019   HCT 31.2 (L) 09/02/2019   MCV 96.6 09/02/2019   PLT 280 09/02/2019   CMP Latest Ref Rng & Units 09/03/2019  Glucose 70 - 99 mg/dL 89  BUN 6 - 20 mg/dL 5(L)  Creatinine 0.44 - 1.00 mg/dL 0.78  Sodium 135 - 145 mmol/L 133(L)  Potassium 3.5 - 5.1 mmol/L 3.4(L)  Chloride 98 - 111 mmol/L 104  CO2 22 - 32 mmol/L 19(L)  Calcium 8.9 - 10.3 mg/dL 8.8(L)  Total Protein 6.5 - 8.1 g/dL 5.3(L)  Total Bilirubin 0.3 - 1.2 mg/dL 1.0  Alkaline Phos 38 - 126 U/L 178(H)  AST 15 - 41 U/L 19  ALT 0 - 44 U/L 10  Discharge instruction: per After Visit Summary and "Baby and Me Booklet".  After visit meds:  Allergies as of 09/04/2019      Reactions   Penicillins Anaphylaxis   Did it involve swelling of the face/tongue/throat, SOB, or low BP? Yes Did it involve sudden or severe rash/hives, skin peeling, or any reaction on the inside of your mouth or nose? Yes Did you need to seek medical attention at a hospital or doctor's office? Yes When did it last happen? If all above answers are "NO", may proceed with cephalosporin use.      Medication List    STOP taking these medications   Doxylamine-Pyridoxine 10-10 MG Tbec   metroNIDAZOLE 500 MG tablet Commonly known as: FLAGYL   multivitamin-prenatal 27-0.8 MG Tabs tablet   polyethylene glycol 17 g packet Commonly known as: MIRALAX / GLYCOLAX   Prenatal Adult Gummy/DHA/FA 0.4-25 MG Chew   pyridOXINE 25 MG tablet Commonly known as: VITAMIN B-6     TAKE these medications   acetaminophen 500 MG tablet Commonly known as: TYLENOL Take 500 mg by mouth every 6 (six) hours as needed for mild pain.   calcium carbonate 500 MG chewable tablet Commonly known as: TUMS - dosed in mg elemental calcium Chew 2 tablets by mouth as needed for indigestion or heartburn.    ibuprofen 600 MG tablet Commonly known as: ADVIL Take 1 tablet (600 mg total) by mouth every 6 (six) hours.   nicotine 7 mg/24hr patch Commonly known as: EQ Nicotine Place 1 patch (7 mg total) onto the skin daily.   prenatal multivitamin Tabs tablet Take 1 tablet by mouth daily at 12 noon.     ASK your doctor about these medications   polyethylene glycol powder 17 GM/SCOOP powder Commonly known as: GLYCOLAX/MIRALAX Take 255 g by mouth once for 1 dose. Ask about: Should I take this medication?       Diet: routine diet  Activity: Advance as tolerated. Pelvic rest for 6 weeks.   Outpatient follow up: Follow up Appt:No future appointments. Follow up Visit: Catherine. Schedule an appointment as soon as possible for a visit in 6 week(s).   Why: For postpartum visit Contact information: [place]           Please schedule this patient for Postpartum visit in: 6 weeks with the following provider: Any provider For C/S patients schedule nurse incision check in weeks 2 weeks: no Low risk pregnancy complicated by: daily THC use & 3 cigarette/day smoker Delivery mode:  SVD Anticipated Birth Control:  Nexplanon PP Procedures needed: none  Schedule Integrated [place] visit: no   Newborn Data: Live born female "Braylen" Birth Weight: 7 lbs 6 oz APGAR: 8, 9  Newborn Delivery   Birth date/time: 09/02/2019 14:01:00 Delivery type: Vaginal, Spontaneous      Baby Feeding: Bottle Disposition:home with mother   09/04/2019 Christin Fudge, CNM

## 2019-09-02 NOTE — Anesthesia Procedure Notes (Signed)
Epidural Patient location during procedure: OB  Staffing Anesthesiologist: Kairie Vangieson, MD Performed: anesthesiologist   Preanesthetic Checklist Completed: patient identified, site marked, surgical consent, pre-op evaluation, timeout performed, IV checked, risks and benefits discussed and monitors and equipment checked  Epidural Patient position: sitting Prep: DuraPrep Patient monitoring: heart rate, continuous pulse ox and blood pressure Approach: right paramedian Location: L3-L4 Injection technique: LOR saline  Needle:  Needle type: Tuohy  Needle gauge: 17 G Needle length: 9 cm and 9 Needle insertion depth: 6 cm Catheter type: closed end flexible Catheter size: 20 Guage Catheter at skin depth: 10 cm Test dose: negative  Assessment Events: blood not aspirated, injection not painful, no injection resistance, negative IV test and no paresthesia  Additional Notes Patient identified. Risks/Benefits/Options discussed with patient including but not limited to bleeding, infection, nerve damage, paralysis, failed block, incomplete pain control, headache, blood pressure changes, nausea, vomiting, reactions to medication both or allergic, itching and postpartum back pain. Confirmed with bedside nurse the patient's most recent platelet count. Confirmed with patient that they are not currently taking any anticoagulation, have any bleeding history or any family history of bleeding disorders. Patient expressed understanding and wished to proceed. All questions were answered. Sterile technique was used throughout the entire procedure. Please see nursing notes for vital signs. Test dose was given through epidural needle and negative prior to continuing to dose epidural or start infusion. Warning signs of high block given to the patient including shortness of breath, tingling/numbness in hands, complete motor block, or any concerning symptoms with instructions to call for help. Patient was given  instructions on fall risk and not to get out of bed. All questions and concerns addressed with instructions to call with any issues.     

## 2019-09-03 LAB — COMPREHENSIVE METABOLIC PANEL
ALT: 10 U/L (ref 0–44)
AST: 19 U/L (ref 15–41)
Albumin: 2.2 g/dL — ABNORMAL LOW (ref 3.5–5.0)
Alkaline Phosphatase: 178 U/L — ABNORMAL HIGH (ref 38–126)
Anion gap: 10 (ref 5–15)
BUN: 5 mg/dL — ABNORMAL LOW (ref 6–20)
CO2: 19 mmol/L — ABNORMAL LOW (ref 22–32)
Calcium: 8.8 mg/dL — ABNORMAL LOW (ref 8.9–10.3)
Chloride: 104 mmol/L (ref 98–111)
Creatinine, Ser: 0.78 mg/dL (ref 0.44–1.00)
GFR calc Af Amer: >60 mL/min (ref >60)
GFR calc non Af Amer: >60 mL/min (ref >60)
Glucose, Bld: 89 mg/dL (ref 70–99)
Potassium: 3.4 mmol/L — ABNORMAL LOW (ref 3.5–5.1)
Sodium: 133 mmol/L — ABNORMAL LOW (ref 135–145)
Total Bilirubin: 1 mg/dL (ref 0.3–1.2)
Total Protein: 5.3 g/dL — ABNORMAL LOW (ref 6.5–8.1)

## 2019-09-03 LAB — RPR: RPR Ser Ql: NONREACTIVE

## 2019-09-03 LAB — PROTEIN / CREATININE RATIO, URINE
Creatinine, Urine: 55.62 mg/dL
Protein Creatinine Ratio: 0.18 mg/mg{Cre} — ABNORMAL HIGH (ref 0.00–0.15)
Total Protein, Urine: 10 mg/dL

## 2019-09-03 MED ORDER — IBUPROFEN 600 MG PO TABS: 600.0000 mg | ORAL_TABLET | Freq: Four times a day (QID) | ORAL | 0 refills | Status: DC

## 2019-09-03 MED ORDER — POLYETHYLENE GLYCOL 3350 17 GM/SCOOP PO POWD: 1.0000 | Freq: Once | ORAL | 0 refills | Status: AC

## 2019-09-03 NOTE — Anesthesia Postprocedure Evaluation (Signed)
Anesthesia Post Note  Patient: Tonya Scott  Procedure(s) Performed: AN AD HOC LABOR EPIDURAL     Patient location during evaluation: Mother Baby Anesthesia Type: Epidural Level of consciousness: awake Pain management: satisfactory to patient Vital Signs Assessment: post-procedure vital signs reviewed and stable Respiratory status: spontaneous breathing Cardiovascular status: stable Anesthetic complications: no    Last Vitals:  Vitals:   09/03/19 0040 09/03/19 0531  BP: 106/69 109/75  Pulse: 80 78  Resp: 18 18  Temp: 36.4 C 36.5 C  SpO2: 100% 100%    Last Pain:  Vitals:   09/03/19 0531  TempSrc: Oral  PainSc: 0-No pain   Pain Goal:                   [employer]

## 2019-09-03 NOTE — Lactation Note (Signed)
This note was copied from a baby's chart.  Lactation Consultation Note:  P2, infant is 19 hours old. Mother reports that she has a 32 yr old that she didn't breastfeed. Mother plans to breast and bottle . She reports that she has breastfed infant once yesterday.a Mother reports that infant suckled for several mins.  Mother has been bottle feeding formula . Infant has been taking 4-5 ml of formula .   Dawson assist with latching infant on the left breast. Infant tugged for several strong sucks. Several attempts to latch infant and no sustained latch.  Infant placed on alternate breast and no sustained latch. .infant refused to suck on LC gloved finger.   Reviewed hand expression. Observed drops of colostrum.  LC attempt to bottle feed infant with slow flow nipple. Infant very gaggy and spitty. Infant took 1-2 ml of formula and then gagged and spit.   Informed staff nurse that infant is a poor feeder.  Mother to continue to do suck training and be aware of gag reflex. Mother to offer breast and then supplement infant.   DEBP sat up for mother and assist with pumping using a # 27 flange.  Mother to continue to pump every 2-3 hours for 15-20 mins.  Patient Name: Tonya Scott Today's Date: 09/03/2019 Reason for consult: Initial assessment   Maternal Data    Feeding Feeding Type: Formula Nipple Type: Slow - flow  LATCH Score Latch: Repeated attempts needed to sustain latch, nipple held in mouth throughout feeding, stimulation needed to elicit sucking reflex.  Audible Swallowing: None  Type of Nipple: Everted at rest and after stimulation  Comfort (Breast/Nipple): Soft / non-tender  Hold (Positioning): Assistance needed to correctly position infant at breast and maintain latch.  LATCH Score: 6  Interventions Interventions: Breast feeding basics reviewed;Assisted with latch;Skin to skin;Adjust position;Support pillows;Position options  Lactation Tools Discussed/Used WIC  Program: Yes Pump Review: Setup, frequency, and cleaning;Milk Storage Initiated by:: Jess Barters Date initiated:: 09/03/19   Consult Status Consult Status: Follow-up Date: 09/03/19 Follow-up type: In-patient    Jess Barters [facility] 09/03/2019, 12:40 PM

## 2019-09-03 NOTE — Progress Notes (Signed)
POSTPARTUM PROGRESS NOTE  Post Partum Day 1  Subjective:  Tonya Scott is a 33 y.o. [id number] s/p NSVD at [redacted]w[redacted]d.  She reports she is doing well. No acute events overnight. She denies any problems with ambulating, voiding or po intake. Denies nausea or vomiting.  Pain is well controlled.  Lochia is appropriate.  Objective: Blood pressure 109/75, pulse 78, temperature 97.7 F (36.5 C), temperature source Oral, resp. rate 18, height 5\' 8"  (1.727 m), weight 97.3 kg, last menstrual period 12/01/2018, SpO2 100 %, unknown if currently breastfeeding.  Physical Exam:  General: alert, cooperative and no distress Chest: no respiratory distress Heart:regular rate, distal pulses intact Abdomen: soft, nontender,  Uterine Fundus: firm, appropriately tender DVT Evaluation: No calf swelling or tenderness Extremities: no LE edema Skin: warm, dry  Recent Labs    09/02/19 1209  HGB 10.7*  HCT 31.2*    Assessment/Plan: Tonya Scott is a 33 y.o. [id number] s/p NSVD at [redacted]w[redacted]d   PPD#1 - Doing well  Routine postpartum care Contraception: s/p nexplanon Feeding: bottle Dispo: Plan for discharge later today pending BP's.  GHTN: not quite meeting criteria with BP's just under 4hr but mild ranges, labs ordered. Discussed that as long as BP's remain well controlled and labs normal can likely d/c this afternoon.    LOS: 1 day   Augustin Coupe, MD/[facility] OB Fellow  09/03/2019, 7:39 AM

## 2019-09-03 NOTE — Clinical Social Work Maternal (Signed)
CLINICAL SOCIAL WORK MATERNAL/CHILD NOTE  Patient Details  Name: Tonya Scott MRN: [id number] Date of Birth: Jan 15, 1986  Date:  09/03/2019  Clinical Social Worker Initiating Note:  Elijio Miles Date/Time: Initiated:  09/03/19/1000     Child's Name:  Tonya Scott   Biological Parents:  Mother, Father(Ashanty Garn and Nigel Berthold DOB: 09/25/1988)   Need for Interpreter:  None   Reason for Referral:  Behavioral Health Concerns, Current Substance Use/Substance Use During Pregnancy    Address:  [address], [place], [place] [address]   Phone number:  [phone] (home)     Additional phone number:   Household Members/Support Persons (HM/SP):   Household Member/Support Person 1, Household Member/Support Person 2, Household Member/Support Person 3   HM/SP Name Relationship DOB or Age  HM/SP -1 Nigel Berthold FOB 09/25/1988  HM/SP -2 Ria Clock Starliper Daughter 12/15/2010  HM/SP -3   Brother 18+  HM/SP -4        HM/SP -5        HM/SP -6        HM/SP -7        HM/SP -8          Natural Supports (not living in the home):  Extended Family, Friends, Immediate Family, Artist Supports: None   Employment: Unemployed   Type of Work:     Education:  Programmer, systems   Homebound arranged:    Museum/gallery curator Resources:  Multimedia programmer   Other Resources:  Physicist, medical , [place] Considerations Which May Impact Care:    Strengths:  Ability to meet basic needs , Home prepared for child , Pediatrician chosen   Psychotropic Medications:         Pediatrician:    [place]  Pediatrician List:   [place]      Pediatrician Fax Number:    Risk Factors/Current Problems:  Substance Use    Cognitive State:  Able to Concentrate , Alert , Linear Thinking    Mood/Affect:  Calm ,  Comfortable , Bright , Interested , Happy , Relaxed    CSW Assessment:  CSW received consult for history of anxiety. CSW also aware MOB tested positive for THC on admission. CSW met with MOB to offer support and complete assessment.    MOB sitting up in bed holding infant with FOB present at bedside, when CSW entered the room. CSW introduced self and with MOB's permission asked FOB to step out of the room so that CSW could meet with MOB in private. FOB understanding and left voluntarily. MOB very pleasant and engaged throughout assessment. CSW explained reason for consult to which MOB expressed understanding. MOB reported she currently lives with FOB, her 19-year-old daughter and her brother in [place]. MOB confirmed she receives both [facility] and food stamps and is aware she needs to follow up with both to get infant added to her plans. CSW inquired about MOB's mental health history and MOB acknowledged a history of anxiety starting in 2005. MOB shared she also has a history of panic attacks but hasn't had one in a while. MOB reported she has been "doing pretty good recently" and denied any current medications or counseling and denied any interest, at this time. MOB reported that she feels symptoms are manageable when they arise. MOB  denied any previous PPD with last pregnancy but was receptive to education and reported feeling comfortable reaching out, if needed. CSW provided education regarding the baby blues period vs. perinatal mood disorders, discussed treatment and gave resources for mental health follow up if concerns arise.  CSW recommends self-evaluation during the postpartum time period using the New Mom Checklist from Postpartum Progress and encouraged MOB to contact a medical professional if symptoms are noted at any time. MOB did not appear to be displaying any acute mental health symptoms and denied any current SI, HI or DV. MOB reported feeling well supported by her support system  consisting of her mother, FOB, FOB's aunt, her brother and close friends.  CSW inquired about MOB's substance use history and MOB acknowledged using marijuana during her pregnancy to help with her nausea and appetite. CSW informed MOB of Hospital Drug Policy and explained UDS and CDS were still pending but that a CPS report would be made, if warranted. MOB reported having previous CPS involvement a few years ago after someone called with concerns regarding MOB's mental health and substance use. MOB reported case was eventually closed. MOB denied any questions or concerns regarding policy.   MOB confirmed having all essential items for infant once discharged and reported infant would be sleeping in a pack 'n' play once home. CSW provided review of Sudden Infant Death Syndrome (SIDS) precautions and safe sleeping habits.     CSW Plan/Description:  No Further Intervention Required/No Barriers to Discharge, Sudden Infant Death Syndrome (SIDS) Education, Perinatal Mood and Anxiety Disorder (PMADs) Education, [place], Other Information/Referral to [employer], CSW Will Continue to Monitor Umbilical Cord Tissue Drug Screen Results and Make Report if [place], [place] 09/03/2019, 10:59 AM

## 2019-09-04 NOTE — Discharge Instructions (Signed)

## 2019-09-04 NOTE — Progress Notes (Signed)
CSW aware infant's UDS came back positive for THC. CSW made [facility] CPS report. CPS to follow up within 72 hours.  No barriers to discharge, at this time.  Tonya Scott, [address]  Women[facility] and [employer] [phone]

## 2019-10-16 ENCOUNTER — Ambulatory Visit: Payer: Medicare Other | Admitting: Family Medicine

## 2019-10-16 NOTE — Progress Notes (Deleted)
   Subjective:    Patient ID: Tonya Scott, female    DOB: 1986-07-30, 33 y.o.   MRN: [id number]   CC: Postpartum follow-up  HPI: Tonya Scott is a 33 year old G4 P2022 presenting for postpartum follow-up after NSVD on 11/4 at [redacted]w[redacted]d.  No complications with delivery and had an uncomplicated postpartum course.  No vaginal lacerations, had epidural.  She had a Nexplanon placed prior to discharge after delivery.  Low risk pregnancy complicated by daily THC use and 3 cigarettes a day tobacco use.   Smoking status reviewed  Review of Systems Per HPI    Objective:  There were no vitals taken for this visit. Vitals and nursing note reviewed  General: NAD, pleasant Cardiac: RRR, normal heart sounds, no murmurs Respiratory: CTAB, normal effort Abdomen: soft, nontender, nondistended Extremities: no edema or cyanosis. WWP. Skin: warm and dry, no rashes noted Neuro: alert and oriented, no focal deficits Psych: normal affect  Assessment & Plan:    No problem-specific Assessment & Plan notes found for this encounter.    [facility] Resident PGY-2

## 2019-11-10 ENCOUNTER — Ambulatory Visit: Payer: Medicare Other | Admitting: Family Medicine

## 2019-11-10 NOTE — Progress Notes (Deleted)
   Subjective:    Patient ID: Tonya Scott, female    DOB: [birth year], 34 y.o.   MRN: [id number]   CC: Postpartum visit  HPI: Tonya Scott is a 34 year old G54P2022 female presenting for postpartum visit after NSVD at [redacted]w[redacted]d.  No complications during delivery, pregnancy complicated by daily THC use and 3 cigarettes/day smoker.  No vaginal lacerations, had epidural.  She had a baby boy named Tonya Scott.  Nexplanon placed postpartum for contraception.  Due to infant's UDS returned positive for [facility], CPS report was made during hospitalization without barriers to discharge.  She is due for a Pap smear.   Smoking status reviewed  Review of Systems Per HPI    Objective:  There were no vitals taken for this visit. Vitals and nursing note reviewed  General: NAD, pleasant Cardiac: RRR, normal heart sounds, no murmurs Respiratory: CTAB, normal effort Abdomen: soft, nontender, nondistended Extremities: no edema or cyanosis. WWP. Skin: warm and dry, no rashes noted Neuro: alert and oriented, no focal deficits Psych: normal affect  Assessment & Plan:    No problem-specific Assessment & Plan notes found for this encounter.    Leticia Penna DO Family Medicine Resident PGY-2

## 2020-02-19 IMAGING — US US MFM OB DETAIL +14 WK
1 series · 13 of 28 positions shown · non-contrast
Comparison: none

[Series 1: us mfm ob detail +14 wk · 106 acquisitions, 13 frames shown]
[im 4/106]
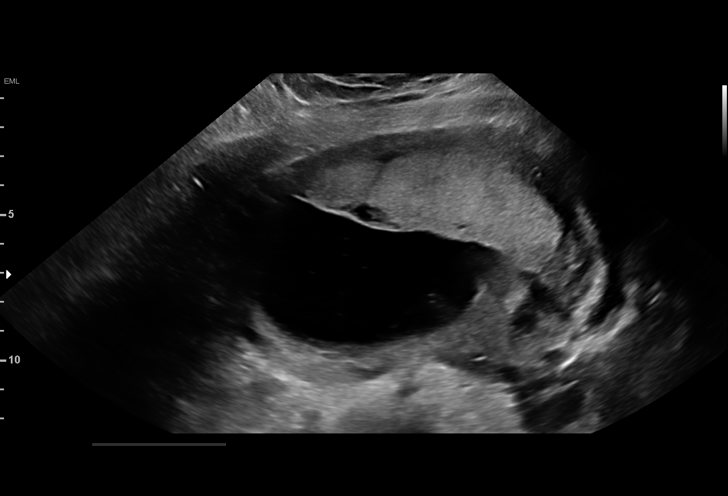
[im 12/106]
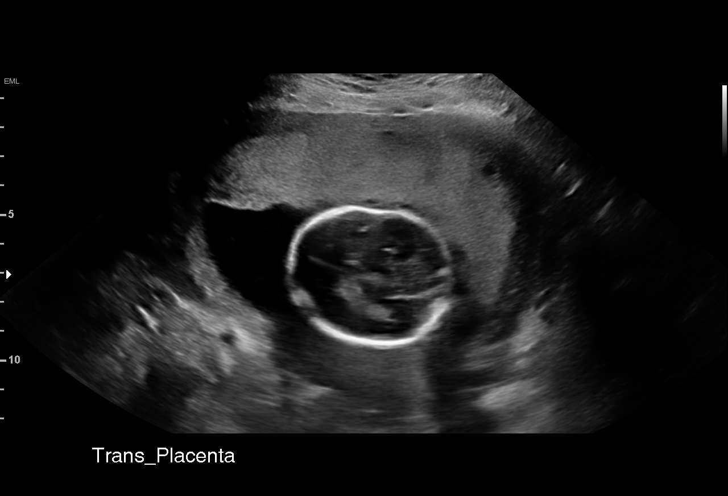
[im 20/106]
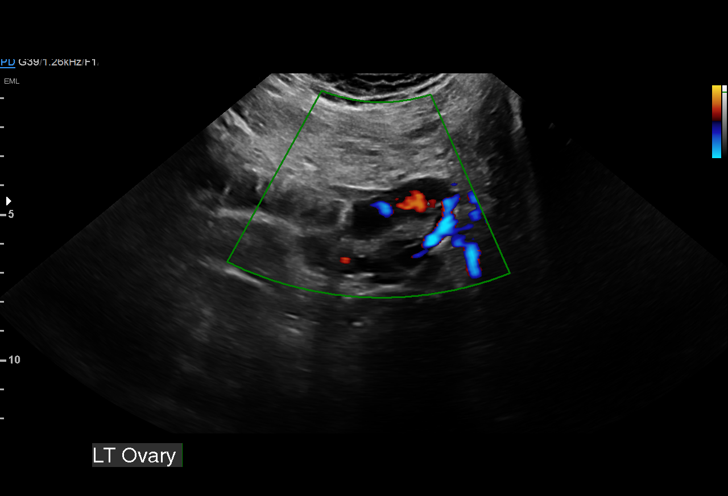
[im 28/106]
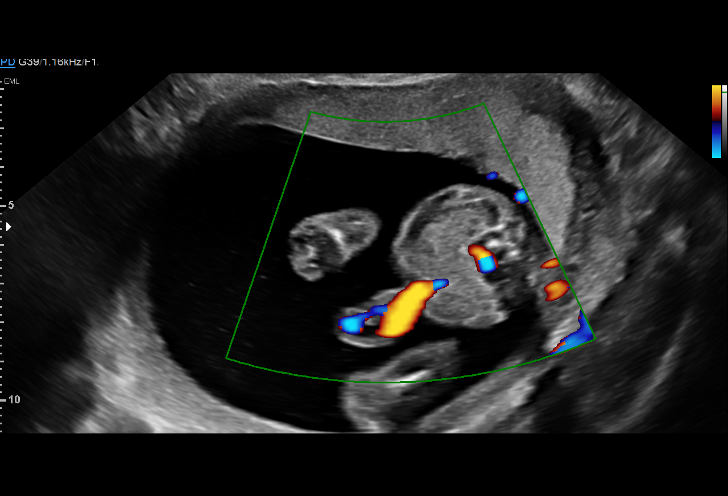
[im 36/106]
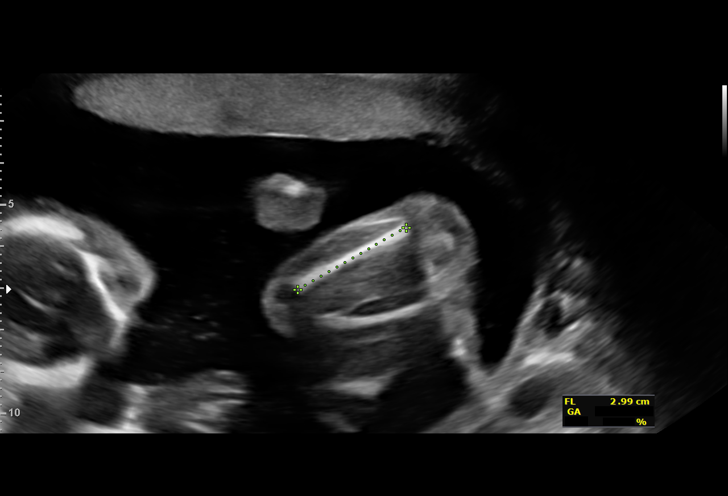
[im 43/106]
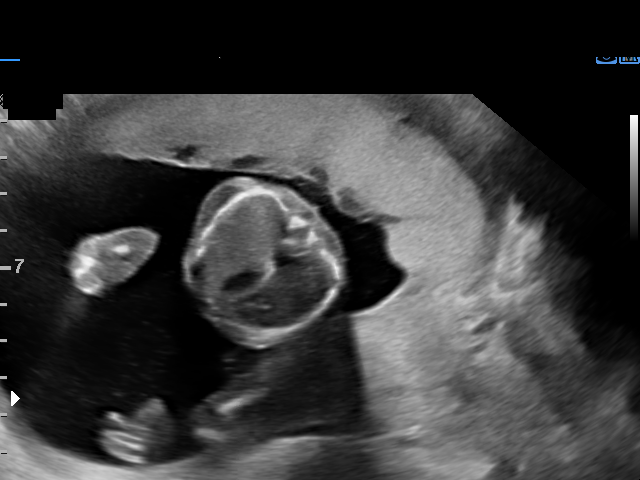
[im 55/106]
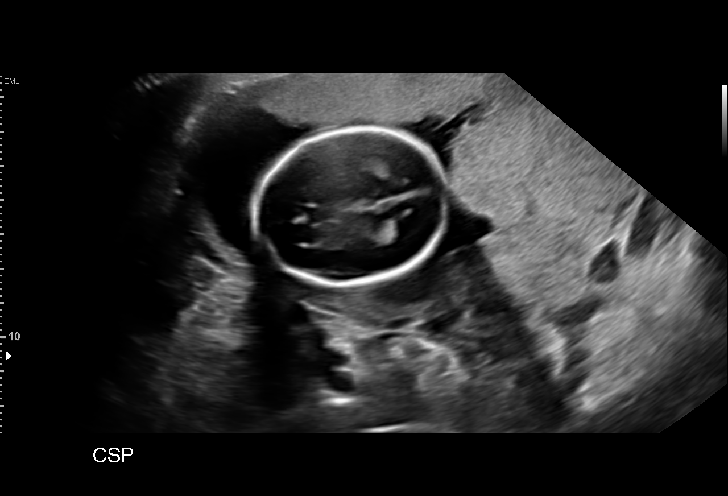
[im 63/106]
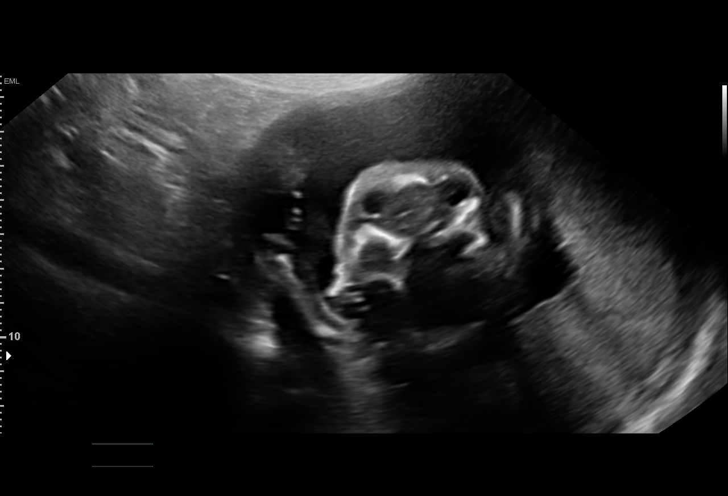
[im 71/106]
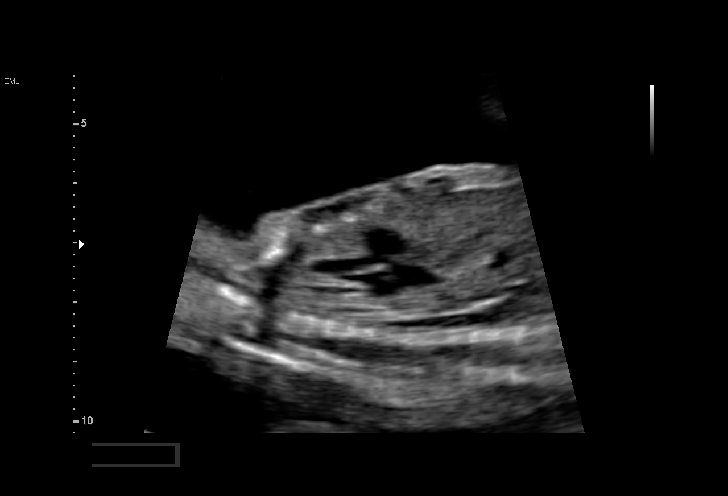
[im 78/106]
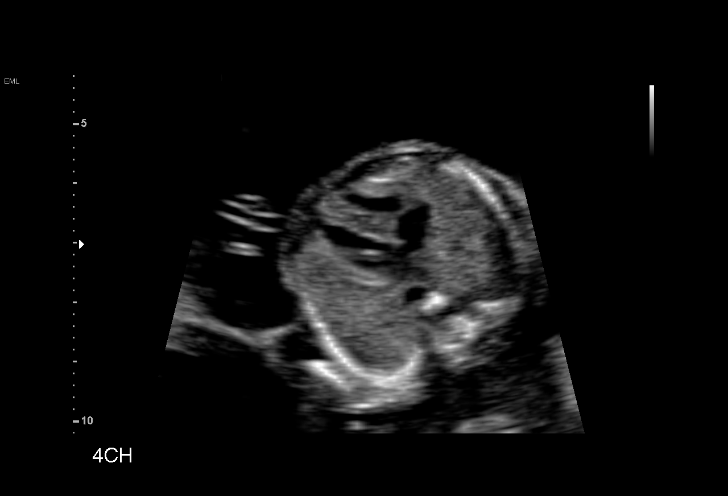
[im 86/106]
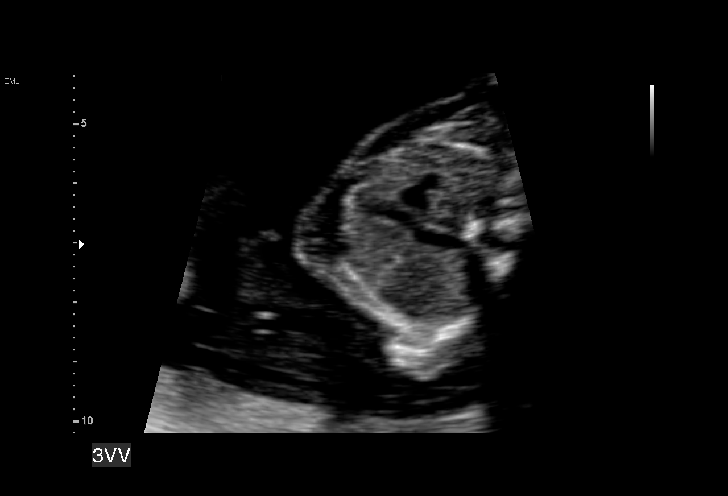
[im 94/106]
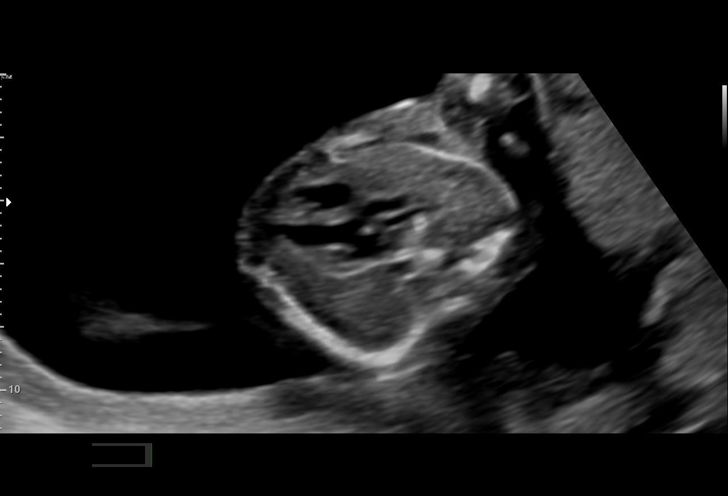
[im 102/106]
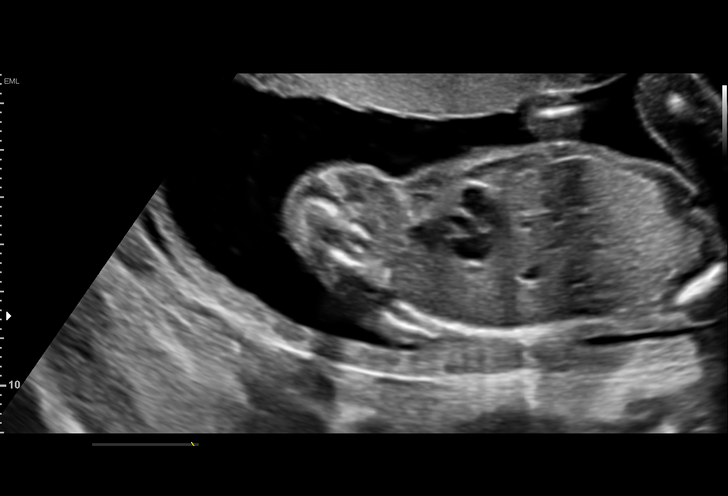

[13 of 28 positions shown; findings below may reference images not displayed]

[REDACTED]

 ----------------------------------------------------------------------

 ----------------------------------------------------------------------
Indications

  19 weeks gestation of pregnancy
  Encounter for antenatal screening for
  malformations (Negative Quad screen)
  Tobacco use complicating pregnancy,
  second trimester (3 cigarettes/day)
  History of sickle cell trait
  Abnormal fetal ultrasound (small fetal
  stomach)
 ----------------------------------------------------------------------
Vital Signs

 BMI:
Fetal Evaluation

 Num Of Fetuses:         1
 Fetal Heart Rate(bpm):  141
 Cardiac Activity:       Observed
 Presentation:           Transverse, head to maternal right
 Placenta:               Anterior
 P. Cord Insertion:      Visualized, central

 Amniotic Fluid
 AFI FV:      Within normal limits

                             Largest Pocket(cm)

Biometry

 BPD:      44.7  mm     G. Age:  19w 4d         61  %    CI:        73.01   %    70 - 86
                                                         FL/HC:      18.0   %    16.1 -
 HC:      166.3  mm     G. Age:  19w 2d         45  %    HC/AC:      1.11        1.09 -
 AC:      149.7  mm     G. Age:  20w 1d         76  %    FL/BPD:     66.9   %
 FL:       29.9  mm     G. Age:  19w 2d         41  %    FL/AC:      20.0   %    20 - 24
 HUM:      31.2  mm     G. Age:  20w 3d         80  %
 CER:        20  mm     G. Age:  19w 0d         45  %
 NFT:       4.7  mm
 LV:        5.5  mm
 CM:        4.3  mm

 Est. FW:     308  gm    0 lb 11 oz      53  %
OB History

 Gravidity:    4         Term:   1        Prem:   0        SAB:   2
 TOP:          0       Ectopic:  0        Living: 1
Gestational Age

 LMP:           19w 2d        Date:  12/01/18                 EDD:   09/07/19
 U/S Today:     19w 4d                                        EDD:   09/05/19
 Best:          19w 2d     Det. By:  LMP  (12/01/18)          EDD:   09/07/19
Anatomy

 Cranium:               Appears normal         LVOT:                   Appears normal
 Cavum:                 Appears normal         Aortic Arch:            Appears normal
 Ventricles:            Appears normal         Ductal Arch:            Appears normal
 Choroid Plexus:        Appears normal         Diaphragm:              Appears normal
 Cerebellum:            Appears normal         Stomach:                Present but small,
                                                                       left sided
 Posterior Fossa:       Appears normal         Abdomen:                Appears normal
 Nuchal Fold:           Appears normal         Abdominal Wall:         Appears nml (cord
                                                                       insert, abd wall)
 Face:                  Appears normal         Cord Vessels:           Appears normal (3
                        (orbits and profile)                           vessel cord)
 Lips:                  Appears normal         Kidneys:                Appear normal
 Palate:                Appears normal         Bladder:                Appears normal
 Thoracic:              Appears normal         Spine:                  Appears normal
 Heart:                 Appears normal         Upper Extremities:      Appears normal
                        (4CH, axis, and
                        situs)
 RVOT:                  Not well visualized    Lower Extremities:      Appears normal

 Other:  Heels and 5th digit visualized. Nasal bone visualized. Fetus appears
         to be a male.
Cervix Uterus Adnexa

 Cervix
 Length:           4.54  cm.
 Normal appearance by transabdominal scan.

 Left Ovary
 Small corpus luteum noted.

 Right Ovary
 Within normal limits

 Adnexa
 No abnormality visualized.
Impression

 Normal interval growth.  No ultrasonic evidence of structural
 fetal anomalies.
 Suboptimal views of the fetal anatomy was obtained
 secondary to fetal position.
 The stomach was small but filled and left sided.
Recommendations

 Follow up growth and anatomy in 4 weeks.

## 2020-07-01 ENCOUNTER — Other Ambulatory Visit: Payer: Medicare Other

## 2021-10-03 ENCOUNTER — Encounter: Payer: Self-pay | Admitting: Family Medicine

## 2021-10-03 ENCOUNTER — Ambulatory Visit (INDEPENDENT_AMBULATORY_CARE_PROVIDER_SITE_OTHER): Payer: Medicare Other | Admitting: Family Medicine

## 2021-10-03 ENCOUNTER — Other Ambulatory Visit: Payer: Self-pay

## 2021-10-03 ENCOUNTER — Other Ambulatory Visit (HOSPITAL_COMMUNITY)
Admission: RE | Admit: 2021-10-03 | Discharge: 2021-10-03 | Disposition: A | Discharge status: Home / Self Care | Payer: Medicare Other | Source: Ambulatory Visit | Attending: Family Medicine | Admitting: Family Medicine

## 2021-10-03 VITALS — BP 110/70 | HR 106 | Ht 68.0 in | Wt 207.8 lb

## 2021-10-03 DIAGNOSIS — Z113 Encounter for screening for infections with a predominantly sexual mode of transmission: Secondary | ICD-10-CM

## 2021-10-03 DIAGNOSIS — R5383 Other fatigue: Secondary | ICD-10-CM

## 2021-10-03 DIAGNOSIS — Z0000 Encounter for general adult medical examination without abnormal findings: Secondary | ICD-10-CM | POA: Diagnosis not present

## 2021-10-03 DIAGNOSIS — R635 Abnormal weight gain: Secondary | ICD-10-CM

## 2021-10-03 DIAGNOSIS — Z01419 Encounter for gynecological examination (general) (routine) without abnormal findings: Secondary | ICD-10-CM | POA: Insufficient documentation

## 2021-10-03 DIAGNOSIS — M545 Low back pain, unspecified: Secondary | ICD-10-CM

## 2021-10-03 DIAGNOSIS — Z1151 Encounter for screening for human papillomavirus (HPV): Secondary | ICD-10-CM | POA: Diagnosis not present

## 2021-10-03 DIAGNOSIS — Z124 Encounter for screening for malignant neoplasm of cervix: Secondary | ICD-10-CM

## 2021-10-03 NOTE — Patient Instructions (Addendum)
It was great seeing you today!  Today you were seen for a physical. We did your pap smear and STI screening. We are getting labs to check on your thyroid, your blood count, cholesterol, and will call you with any abnormal results.   For your lower back pain I included some exercises to do for 30 seconds each, 3 rounds. You can take Tylenol or ibuprofen for pain. We will check on your back pain and discuss in further detail at your follow up.  Please check-out at the front desk before leaving the clinic. I'd like to see you back in about 2 weeks to check on your fatigue and other symptoms as well as your back pain, but if you need to be seen earlier than that for any new issues we're happy to fit you in, just give [place] a call!  Regarding lab work today:  Due to recent changes in healthcare laws, you may see the results of your imaging and laboratory studies on MyChart before your provider has had a chance to review them.  I understand that in some cases there may be results that are confusing or concerning to you. Not all laboratory results come back in the same time frame and you may be waiting for multiple results in order to interpret others.  Please give [place] 72 hours in order for your provider to thoroughly review all the results before contacting the office for clarification of your results. If everything is normal, you will get a letter in the mail or a message in My Chart. Please give [place] a call if you do not hear from [place] after 2 weeks.  Please bring all of your medications with you to each visit.    If you haven't already, sign up for My Chart to have easy access to your labs results, and communication with your primary care physician.  Feel free to call with any questions or concerns at any time, at [phone].   Take care,  Dr. Cora Collum [facility]   Low Back Sprain or Strain Rehab Ask your health care provider which exercises are safe for you. Do  exercises exactly as told by your health care provider and adjust them as directed. It is normal to feel mild stretching, pulling, tightness, or discomfort as you do these exercises. Stop right away if you feel sudden pain or your pain gets worse. Do not begin these exercises until told by your health care provider. Stretching and range-of-motion exercises These exercises warm up your muscles and joints and improve the movement and flexibility of your back. These exercises also help to relieve pain, numbness, and tingling. Lumbar rotation  Lie on your back on a firm bed or the floor with your knees bent. Straighten your arms out to your sides so each arm forms a 90-degree angle (right angle) with a side of your body. Slowly move (rotate) both of your knees to one side of your body until you feel a stretch in your lower back (lumbar). Try not to let your shoulders lift off the floor. Hold this position for __________ seconds. Tense your abdominal muscles and slowly move your knees back to the starting position. Repeat this exercise on the other side of your body. Repeat __________ times. Complete this exercise __________ times a day. Single knee to chest  Lie on your back on a firm bed or the floor with both legs straight. Bend one of your knees. Use your hands to  move your knee up toward your chest until you feel a gentle stretch in your lower back and buttock. Hold your leg in this position by holding on to the front of your knee. Keep your other leg as straight as possible. Hold this position for __________ seconds. Slowly return to the starting position. Repeat with your other leg. Repeat __________ times. Complete this exercise __________ times a day. Prone extension on elbows  Lie on your abdomen on a firm bed or the floor (prone position). Prop yourself up on your elbows. Use your arms to help lift your chest up until you feel a gentle stretch in your abdomen and your lower  back. This will place some of your body weight on your elbows. If this is uncomfortable, try stacking pillows under your chest. Your hips should stay down, against the surface that you are lying on. Keep your hip and back muscles relaxed. Hold this position for __________ seconds. Slowly relax your upper body and return to the starting position. Repeat __________ times. Complete this exercise __________ times a day. Strengthening exercises These exercises build strength and endurance in your back. Endurance is the ability to use your muscles for a long time, even after they get tired. Pelvic tilt This exercise strengthens the muscles that lie deep in the abdomen. Lie on your back on a firm bed or the floor with your legs extended. Bend your knees so they are pointing toward the ceiling and your feet are flat on the floor. Tighten your lower abdominal muscles to press your lower back against the floor. This motion will tilt your pelvis so your tailbone points up toward the ceiling instead of pointing to your feet or the floor. To help with this exercise, you may place a small towel under your lower back and try to push your back into the towel. Hold this position for __________ seconds. Let your muscles relax completely before you repeat this exercise. Repeat __________ times. Complete this exercise __________ times a day. Alternating arm and leg raises  Get on your hands and knees on a firm surface. If you are on a hard floor, you may want to use padding, such as an exercise mat, to cushion your knees. Line up your arms and legs. Your hands should be directly below your shoulders, and your knees should be directly below your hips. Lift your left leg behind you. At the same time, raise your right arm and straighten it in front of you. Do not lift your leg higher than your hip. Do not lift your arm higher than your shoulder. Keep your abdominal and back muscles tight. Keep your hips facing the  ground. Do not arch your back. Keep your balance carefully, and do not hold your breath. Hold this position for __________ seconds. Slowly return to the starting position. Repeat with your right leg and your left arm. Repeat __________ times. Complete this exercise __________ times a day. Abdominal set with straight leg raise  Lie on your back on a firm bed or the floor. Bend one of your knees and keep your other leg straight. Tense your abdominal muscles and lift your straight leg up, 4-6 inches (10-15 cm) off the ground. Keep your abdominal muscles tight and hold this position for __________ seconds. Do not hold your breath. Do not arch your back. Keep it flat against the ground. Keep your abdominal muscles tense as you slowly lower your leg back to the starting position. Repeat with your other leg. Repeat  __________ times. Complete this exercise __________ times a day. Single leg lower with bent knees Lie on your back on a firm bed or the floor. Tense your abdominal muscles and lift your feet off the floor, one foot at a time, so your knees and hips are bent in 90-degree angles (right angles). Your knees should be over your hips and your lower legs should be parallel to the floor. Keeping your abdominal muscles tense and your knee bent, slowly lower one of your legs so your toe touches the ground. Lift your leg back up to return to the starting position. Do not hold your breath. Do not let your back arch. Keep your back flat against the ground. Repeat with your other leg. Repeat __________ times. Complete this exercise __________ times a day. Posture and body mechanics Good posture and healthy body mechanics can help to relieve stress in your body's tissues and joints. Body mechanics refers to the movements and positions of your body while you do your daily activities. Posture is part of body mechanics. Good posture means: Your spine is in its natural S-curve position  (neutral). Your shoulders are pulled back slightly. Your head is not tipped forward (neutral). Follow these guidelines to improve your posture and body mechanics in your everyday activities. Standing  When standing, keep your spine neutral and your feet about hip-width apart. Keep a slight bend in your knees. Your ears, shoulders, and hips should line up. When you do a task in which you stand in one place for a long time, place one foot up on a stable object that is 2-4 inches (5-10 cm) high, such as a footstool. This helps keep your spine neutral. Sitting  When sitting, keep your spine neutral and keep your feet flat on the floor. Use a footrest, if necessary, and keep your thighs parallel to the floor. Avoid rounding your shoulders, and avoid tilting your head forward. When working at a desk or a computer, keep your desk at a height where your hands are slightly lower than your elbows. Slide your chair under your desk so you are close enough to maintain good posture. When working at a computer, place your monitor at a height where you are looking straight ahead and you do not have to tilt your head forward or downward to look at the screen. Resting When lying down and resting, avoid positions that are most painful for you. If you have pain with activities such as sitting, bending, stooping, or squatting, lie in a position in which your body does not bend very much. For example, avoid curling up on your side with your arms and knees near your chest (fetal position). If you have pain with activities such as standing for a long time or reaching with your arms, lie with your spine in a neutral position and bend your knees slightly. Try the following positions: Lying on your side with a pillow between your knees. Lying on your back with a pillow under your knees. Lifting  When lifting objects, keep your feet at least shoulder-width apart and tighten your abdominal muscles. Bend your knees and hips  and keep your spine neutral. It is important to lift using the strength of your legs, not your back. Do not lock your knees straight out. Always ask for help to lift heavy or awkward objects. This information is not intended to replace advice given to you by your health care provider. Make sure you discuss any questions you have with your health  care provider. Document Revised: 01/02/2021 Document Reviewed: 01/02/2021 Elsevier Patient Education  2022 [employer].

## 2021-10-03 NOTE — Progress Notes (Addendum)
    SUBJECTIVE:   CHIEF COMPLAINT / HPI:   Tonya Scott (MRN: [id number]) is a 35 y.o. female with a history of goiter s/p radioactive iodine ablation who presents for evaluation of fatigue, lower back pain, and pap smear.  Fatigue Patient states she has had symptoms of fatigue for 2 years. In addition to fatigue, she has had weight fluctuations "between 150-220 pounds," mood fluctuations, hot flashes, diarrhea, and little appetite over this period. Of note, she has a history of radioiodine ablation of her goiter. She also has a history of anemia and low vitamin B12 during pregnancy 2 years ago. She had a shot of B12 at that time and states she felt much better afterward. She has been taking oral zinc and vitamin B12 supplements OTC with minimal relief of her current symptoms. She has also been taking an antibiotic in preparation for a root canal this Thursday (12/8) and is unsure if that is contributing to her symptoms. Overall, she feels that her symptoms are due to her thyroid function, and she would like it checked.  Lower back pain Patient has been experiencing lower back pain recently. She has been seen for this issue before where she was told it was due to her breasts; she was given a muscle relaxer at that time. She denies any weakness or gait changes. She does not want any medications at this time, but she would like some information on how to relieve the pain conservatively.  OBJECTIVE:   BP 110/70   Pulse (!) 106   Ht 5\' 8"  (1.727 m)   Wt 207 lb 12.8 oz (94.3 kg)   SpO2 96%   BMI 31.60 kg/m    PHYSICAL EXAM  GEN: Well-developed, in NAD HEAD: NCAT, neck supple, no thyromegaly appreciated CVS: RRR, normal S1/S2, no murmurs, rubs, gallops RESP: Breathing comfortably on RA, no retractions, wheezes, rhonchi, or crackles PSY: Normal mood and affect, alert and oriented SKIN: No obvious lesions or rashes  EXT: Moves all extremities equally   NEU/MSK: Mild point tenderness to  lower paraspinal muscles bilaterally, strength 5/5 bilaterally, straight leg raise test negative bilaterally, gait normal  ASSESSMENT/PLAN:   No problem-specific Assessment & Plan notes found for this encounter.   Fatigue Patient's constellation of symptoms, in the context of goiter history s/p radioactive iodine ablation, may be due to thyroid dysfunction. Her history of low B12 may also partially explain her fatigue. Given overall well-appearing and benign exam today, will order TSH, CBC, BMP, and B12 to assess her symptoms and also update labs since last values were in 2020. Will also obtain a lipid panel to assess lipid status.   Lower back pain Patient's lower back pain and exam findings are likely muscular in nature. The absence of weakness, gait changes, and positive straight leg raise test is reassuring. Patient would not like medication at this time. Given time constraints today, will provide a handout on conservative management and follow up at next visit if the pain is persistent.  Healthcare maintenance Patient received her pap smear today with RPR, HIV, and HCV testing; however, she denies being currently sexually active but is requesting STI check. Will follow up results. Will also discuss flu and COVID-19 vaccines at next visit due to time constraints.  Recommended follow up in 2 weeks   2021, Medical Student [facility]

## 2021-10-04 LAB — HCV INTERPRETATION

## 2021-10-04 LAB — CBC
Hematocrit: 42.2 % (ref 34.0–46.6)
Hemoglobin: 14.2 g/dL (ref 11.1–15.9)
MCH: 33.1 pg — ABNORMAL HIGH (ref 26.6–33.0)
MCHC: 33.6 g/dL (ref 31.5–35.7)
MCV: 98 fL — ABNORMAL HIGH (ref 79–97)
Platelets: 217 10*3/uL (ref 150–450)
RBC: 4.29 x10E6/uL (ref 3.77–5.28)
RDW: 12.3 % (ref 11.7–15.4)
WBC: 7.2 10*3/uL (ref 3.4–10.8)

## 2021-10-04 LAB — BASIC METABOLIC PANEL
BUN/Creatinine Ratio: 9 (ref 9–23)
BUN: 8 mg/dL (ref 6–20)
CO2: 22 mmol/L (ref 20–29)
Calcium: 9.4 mg/dL (ref 8.7–10.2)
Chloride: 104 mmol/L (ref 96–106)
Creatinine, Ser: 0.91 mg/dL (ref 0.57–1.00)
Glucose: 80 mg/dL (ref 70–99)
Potassium: 4.1 mmol/L (ref 3.5–5.2)
Sodium: 141 mmol/L (ref 134–144)
eGFR: 85 mL/min/{1.73_m2} (ref >59)

## 2021-10-04 LAB — LIPID PANEL
Chol/HDL Ratio: 3.6 ratio (ref 0.0–4.4)
Cholesterol, Total: 146 mg/dL (ref 100–199)
HDL: 41 mg/dL (ref >39)
LDL Chol Calc (NIH): 84 mg/dL (ref 0–99)
Triglycerides: 116 mg/dL (ref 0–149)
VLDL Cholesterol Cal: 21 mg/dL (ref 5–40)

## 2021-10-04 LAB — VITAMIN B12: Vitamin B-12: 462 pg/mL (ref 232–1245)

## 2021-10-04 LAB — HCV AB W REFLEX TO QUANT PCR: HCV Ab: <0.1 s/co ratio (ref 0.0–0.9)

## 2021-10-04 LAB — RPR: RPR Ser Ql: NONREACTIVE

## 2021-10-04 LAB — HIV ANTIBODY (ROUTINE TESTING W REFLEX): HIV Screen 4th Generation wRfx: NONREACTIVE

## 2021-10-04 LAB — TSH: TSH: 1.42 u[IU]/mL (ref 0.450–4.500)

## 2021-10-05 LAB — CYTOLOGY - PAP
Chlamydia: NEGATIVE
Comment: NEGATIVE
Comment: NEGATIVE
Comment: NEGATIVE
Comment: NORMAL
Diagnosis: NEGATIVE
High risk HPV: NEGATIVE
Neisseria Gonorrhea: NEGATIVE
Trichomonas: NEGATIVE

## 2021-10-20 ENCOUNTER — Ambulatory Visit: Payer: Medicare Other | Admitting: Family Medicine

## 2021-11-07 DIAGNOSIS — R69 Illness, unspecified: Secondary | ICD-10-CM | POA: Diagnosis not present

## 2022-04-03 ENCOUNTER — Encounter: Payer: Self-pay | Admitting: *Deleted

## 2022-07-11 ENCOUNTER — Telehealth: Payer: Self-pay

## 2022-07-11 NOTE — Patient Outreach (Signed)
  Care Coordination   07/11/2022 Name: Tonya Scott MRN: [id number] DOB: [birth year]   Care Coordination Outreach Attempts:  An unsuccessful telephone outreach was attempted today to offer the patient information about available care coordination services as a benefit of their health plan.   Follow Up Plan:  Additional outreach attempts will be made to offer the patient care coordination information and services.   Encounter Outcome:  No Answer  Care Coordination Interventions Activated:  No    Care Coordination Interventions:  No, not indicated    Bary Leriche, RN, MSN [facility] Care Management Care Management Coordinator Direct Line [phone] Toll Free: [phone]  Fax: [phone]

## 2022-07-19 ENCOUNTER — Telehealth: Payer: Self-pay

## 2022-07-19 NOTE — Patient Outreach (Signed)
  Care Coordination   07/19/2022 Name: Tonya Scott MRN: [id number] DOB: 08/05/1986   Care Coordination Outreach Attempts:  A second unsuccessful outreach was attempted today to offer the patient with information about available care coordination services as a benefit of their health plan.     Follow Up Plan:  Additional outreach attempts will be made to offer the patient care coordination information and services.   Encounter Outcome:  No Answer  Care Coordination Interventions Activated:  No   Care Coordination Interventions:  No, not indicated    Jone Baseman, RN, MSN [facility] Care Management Care Management Coordinator Direct Line [phone]

## 2022-07-26 ENCOUNTER — Telehealth: Payer: Self-pay

## 2022-07-26 NOTE — Patient Instructions (Signed)
Visit Information  Thank you for taking time to visit with me today. Please don't hesitate to contact me if I can be of assistance to you.   Following are the goals we discussed today:   Goals Addressed             This Visit's Progress    COMPLETED: Care Coordination Activities-No follow up required       Care Coordination Interventions: Advised patient to schedule annual wellness visit            If you are experiencing a Mental Health or Behavioral Health Crisis or need someone to talk to, please call the Suicide and Crisis Lifeline: 988   Patient verbalizes understanding of instructions and care plan provided today and agrees to view in MyChart. Active MyChart status and patient understanding of how to access instructions and care plan via MyChart confirmed with patient.     No further follow up required: patient decline at this time  Haim Hansson J Bettie Capistran, RN, MSN [facility] Care Management Care Management Coordinator Direct Line [phone]     

## 2022-07-26 NOTE — Patient Outreach (Signed)
  Care Coordination   Initial Visit Note   07/26/2022 Name: Tonya Scott MRN: [id number] DOB: 01-14-1986  Tonya Scott is a 36 y.o. year old female who sees Shary Key, DO for primary care. I spoke with  Tonya Scott by phone today.  What matters to the patients health and wellness today?  None     Goals Addressed             This Visit's Progress    COMPLETED: Care Coordination Activities-No follow up required       Care Coordination Interventions: Advised patient to schedule annual wellness visit.          SDOH assessments and interventions completed:  Yes     Care Coordination Interventions Activated:  Yes  Care Coordination Interventions:  Yes, provided   Follow up plan: No further intervention required.   Encounter Outcome:  Pt. Visit Completed   Jone Baseman, RN, MSN [facility] Care Management Coordinator Direct Line [phone]

## 2022-09-14 ENCOUNTER — Encounter: Payer: Medicare Other | Admitting: Family Medicine

## 2023-08-22 ENCOUNTER — Encounter: Payer: 59 | Admitting: Family Medicine

## 2023-10-15 ENCOUNTER — Encounter: Payer: 59 | Admitting: Family Medicine

## 2023-10-21 ENCOUNTER — Encounter: Payer: 59 | Admitting: Family Medicine

## 2023-11-08 ENCOUNTER — Ambulatory Visit: Payer: 59 | Admitting: Family Medicine

## 2023-11-12 ENCOUNTER — Ambulatory Visit: Payer: Self-pay | Admitting: Family Medicine

## 2023-12-16 ENCOUNTER — Encounter: Payer: Self-pay | Admitting: Family Medicine

## 2023-12-16 NOTE — Progress Notes (Signed)
PCP recommended dismissal from the practice due to multiple missed appointments. The dismissal letter was printed and handed over to Vea to process as a certified letter. I verified the names and addresses on the letters and envelopes for each patient and advised Vea to do the same before mailing the letters.

## 2023-12-16 NOTE — Progress Notes (Signed)
PCP requested dismissal for multiple no-shows to appointments.

## 2024-01-08 ENCOUNTER — Encounter: Payer: Self-pay | Admitting: Nurse Practitioner

## 2024-01-08 ENCOUNTER — Ambulatory Visit: Payer: Self-pay | Admitting: Nurse Practitioner

## 2024-01-08 VITALS — BP 121/82 | HR 78 | Temp 97.1°F | Ht 68.0 in | Wt 195.8 lb

## 2024-01-08 DIAGNOSIS — Z8249 Family history of ischemic heart disease and other diseases of the circulatory system: Secondary | ICD-10-CM | POA: Diagnosis not present

## 2024-01-08 DIAGNOSIS — Z6829 Body mass index (BMI) 29.0-29.9, adult: Secondary | ICD-10-CM

## 2024-01-08 DIAGNOSIS — Z0001 Encounter for general adult medical examination with abnormal findings: Secondary | ICD-10-CM | POA: Diagnosis not present

## 2024-01-08 DIAGNOSIS — F1010 Alcohol abuse, uncomplicated: Secondary | ICD-10-CM | POA: Diagnosis not present

## 2024-01-08 DIAGNOSIS — E663 Overweight: Secondary | ICD-10-CM | POA: Diagnosis not present

## 2024-01-08 DIAGNOSIS — Z3046 Encounter for surveillance of implantable subdermal contraceptive: Secondary | ICD-10-CM

## 2024-01-08 LAB — PREGNANCY, URINE: Preg Test, Ur: NEGATIVE

## 2024-01-08 LAB — LIPID PANEL

## 2024-01-08 NOTE — Progress Notes (Addendum)
 New Patient Office Visit  Subjective   Patient ID: Tonya Scott, female    DOB: 1986/01/17  Age: 38 y.o. MRN: [id number]  CC:  Chief Complaint  Patient presents with   Establish Care   Hypertension    B/p was 188/80 yesterday     HPI Tonya Scott presents 01/08/2024 to establish care need to have nexplanon  remove and concerns for fluctuating blood pressure.  PMH bipolar: She goes to [facility] for care but reported that she never took any of the medication prescribed for bipolar open I am prescribed Latuda but that medication makes me feel like a zombie so I refused to take it.  I have not had any manic episode for years so I believe that I have my bipolar under control without any medication.    Blood pressure concern She reports family history of high blood pressure.  Reports in the past few weeks her blood pressure has been fluctuating she does not have a machine at home to check her blood pressure however whenever she goes to [employer] she checked her blood pressure has been around 160/80-84, however yesterday her blood pressure was 188/80 which made her very concerned due to her family history.  BP at the office is normal today client is instructed to continue checking her blood pressure to keep a log for waiting she denied the use of any illicit drugs or any medication that could potentially increase her BP  EtOH abuse She endorses consumption of Etoh 3-4 times weekly 2-4 times weekly beers and hard liquors 5-6 drinks in one sitting.  She already under the care of  [facility] for her mental health advised to talk to her counselor today about her alcohol abuse   Nexplanon  remomoval  Had a nexpanon place Sep 02 2019 and was unaware that it needed to be removed after 3 years  has been 5 years just find out yesterday that it has expired and needed to be removed client has been sexually active and concerned that she might be pregnant, LMP 12/04/2023.  hCG today negative.  Plan to remove  Nexplanon .  Consent obtained from client   Outpatient Encounter Medications as of 01/08/2024  Medication Sig   [DISCONTINUED] vitamin B-12 (CYANOCOBALAMIN ) 500 MCG tablet Take 500 mcg by mouth daily. Unsure of dose (Patient not taking: Reported on 01/08/2024)   [DISCONTINUED] zinc gluconate 50 MG tablet Take 50 mg by mouth daily. Unsure of dose (Patient not taking: Reported on 01/08/2024)   No facility-administered encounter medications on file as of 01/08/2024.    Past Medical History:  Diagnosis Date   Anxiety    Tobacco abuse     Past Surgical History:  Procedure Laterality Date   iimplanon  03/15/2011   NO PAST SURGERIES      Family History  Problem Relation Age of Onset   Healthy Mother    Healthy Father     Social History   Socioeconomic History   Marital status: Single    Spouse name: Not on file   Number of children: Not on file   Years of education: Not on file   Highest education level: 12th grade  Occupational History   Not on file  Tobacco Use   Smoking status: Every Day    Current packs/day: 0.20    Average packs/day: 0.2 packs/day for 5.0 years (1.0 ttl pk-yrs)    Types: Cigarettes   Smokeless tobacco: Never  Vaping Use   Vaping status: Never Used  Substance  and Sexual Activity   Alcohol use: Yes    Alcohol/week: 4.0 standard drinks of alcohol    Types: 4 Cans of beer per week    Comment: liqour couple weekends a month   Drug use: Yes    Types: Marijuana   Sexual activity: Yes    Partners: Male  Other Topics Concern   Not on file  Social History Narrative   ** Merged History Encounter **       Social Drivers of Health   Financial Resource Strain: Low Risk  (01/04/2024)   Overall Financial Resource Strain (CARDIA)    Difficulty of Paying Living Expenses: Not hard at all  Food Insecurity: No Food Insecurity (01/04/2024)   Hunger Vital Sign    Worried About Running Out of Food in the Last Year: Never true    Ran Out of Food in the Last Year:  Never true  Transportation Needs: Unmet Transportation Needs (01/04/2024)   PRAPARE - Administrator, Civil Service (Medical): Yes    Lack of Transportation (Non-Medical): No  Physical Activity: Unknown (01/04/2024)   Exercise Vital Sign    Days of Exercise per Week: 0 days    Minutes of Exercise per Session: Not on file  Stress: Stress Concern Present (01/04/2024)   [employer] of Occupational Health - Occupational Stress Questionnaire    Feeling of Stress : To some extent  Social Connections: Socially Isolated (01/04/2024)   Social Connection and Isolation Panel [NHANES]    Frequency of Communication with Friends and Family: More than three times a week    Frequency of Social Gatherings with Friends and Family: Twice a week    Attends Religious Services: Never    Database administrator or Organizations: No    Attends Engineer, structural: Not on file    Marital Status: Never married  Intimate Partner Violence: Not on file    Review of Systems  Constitutional:  Negative for chills and fever.  HENT:  Negative for ear pain and sore throat.   Respiratory:  Negative for cough and shortness of breath.   Cardiovascular:  Negative for chest pain and leg swelling.  Gastrointestinal:  Negative for nausea and vomiting.  Skin:  Negative for itching and rash.  Neurological:  Negative for dizziness and headaches.  Psychiatric/Behavioral:  Negative for depression, substance abuse and suicidal ideas. The patient is not nervous/anxious and does not have insomnia.    Negative unless indicated in HPI    Objective   BP 121/82   Pulse 78   Temp (!) 97.1 F (36.2 C) (Temporal)   Ht 5' 8 (1.727 m)   Wt 195 lb 12.8 oz (88.8 kg)   SpO2 95%   BMI 29.77 kg/m   Physical Exam Vitals and nursing note reviewed.  Constitutional:      General: She is not in acute distress.    Appearance: Normal appearance.  HENT:     Head: Normocephalic and atraumatic.     Right Ear:  Tympanic membrane, ear canal and external ear normal. There is no impacted cerumen.     Left Ear: Tympanic membrane, ear canal and external ear normal. There is no impacted cerumen.     Nose: Nose normal. No congestion.     Mouth/Throat:     Mouth: Mucous membranes are dry.     Pharynx: Oropharynx is clear.  Eyes:     General: No scleral icterus.    Extraocular Movements: Extraocular movements intact.  Conjunctiva/sclera: Conjunctivae normal.     Pupils: Pupils are equal, round, and reactive to light.  Cardiovascular:     Heart sounds: Normal heart sounds.  Pulmonary:     Effort: Pulmonary effort is normal.     Breath sounds: Normal breath sounds.  Abdominal:     Palpations: Abdomen is soft.  Musculoskeletal:        General: Normal range of motion.     Right lower leg: No edema.     Left lower leg: No edema.  Skin:    General: Skin is warm and dry.     Findings: No rash.  Neurological:     Mental Status: She is alert and oriented to person, place, and time.  Psychiatric:        Mood and Affect: Mood normal.        Behavior: Behavior normal.        Thought Content: Thought content normal.        Judgment: Judgment normal.      Last CBC Lab Results  Component Value Date   WBC 7.2 10/03/2021   HGB 14.2 10/03/2021   HCT 42.2 10/03/2021   MCV 98 (H) 10/03/2021   MCH 33.1 (H) 10/03/2021   RDW 12.3 10/03/2021   PLT 217 10/03/2021   Last metabolic panel Lab Results  Component Value Date   GLUCOSE 80 10/03/2021   NA 141 10/03/2021   K 4.1 10/03/2021   CL 104 10/03/2021   CO2 22 10/03/2021   BUN 8 10/03/2021   CREATININE 0.91 10/03/2021   EGFR 85 10/03/2021   CALCIUM 9.4 10/03/2021   PROT 5.3 (L) 09/03/2019   ALBUMIN 2.2 (L) 09/03/2019   BILITOT 1.0 09/03/2019   ALKPHOS 178 (H) 09/03/2019   AST 19 09/03/2019   ALT 10 09/03/2019   ANIONGAP 10 09/03/2019   Last lipids Lab Results  Component Value Date   CHOL 146 10/03/2021   HDL 41 10/03/2021   LDLCALC  84 10/03/2021   TRIG 116 10/03/2021   CHOLHDL 3.6 10/03/2021   Last hemoglobin A1c No results found for: HGBA1C Last thyroid  functions Lab Results  Component Value Date   TSH 1.420 10/03/2021     Assessment & Plan:  Encounter for general adult medical examination with abnormal findings -     CBC with Differential/Platelet -     CMP14+EGFR  Encounter for Nexplanon  removal -     Pregnancy, urine  Overweight with body mass index (BMI) of 29 to 29.9 in adult -     Lipid panel -     Thyroid  Panel With TSH  ETOH abuse  Family history of hypertension    Tonya is a 38 year old African-American female seen today to establish care and Nexplanon  removal, no acute distress Labs: Lipid, TSH result pending  Elevated blood pressure: Client to continue monitoring blood pressure keep a log and bring the log at the next follow-up appointment  EtOH abuse: Client to talk to her counselor at [facility] about her alcohol abuse  Bipolar: Client to talk to her treatment team at [facility] about possible GeneSight test to improve medication adherence  Client understand since her Nexplanon  is removed she needs to use condom to avoid pregnancy as she will come back f in 1 week for Nexplanon  insertion  Nexplanon  Removal procedure I informed consent obtained from client PROCEDURE NOTE: Nexplanon  insertion Patient given informed consent, signed copy in the chart.  Appropriate time out taken  Pregnancy test was negative.  The patient's  left arm was prepped and draped in the usual sterile fashion.. The ruler used to measure and mark the insertion area 8 cm from medial epicondyle of the elbow. Local anaesthesia obtained using 1.5 cc of 1% lidocaine  with epinephrine. Nexplanon  was removed per manufacturer's directions. Less than 1 cc blood loss. The insertion site covered with steri strips and antibiotic ointment and a pressure bandage to minimize bruising. There were no complications and the patient  tolerated the procedure well.  NEXPLANON  REMOVAL HOME CARE INSTRUCTIONS You had Nexplanon  removed today.  You may notice a small bruise where the device was removed. You may experience mild discomfort for the next 24-48 hours. This is normal. You may have small amounts of bleeding at the insertion site, though bleeding should stop within the next several hours. You may remove your arm wrap in 24 hours.   You may start showering after 24 hours.  DO NOT take a tub bath, swim, or submerge your body in water for the next 5 days. Leave the Steri-Strips stitches (stickers used to keep your wound closed) on for the next 3-5 days. These should fall off by themselves but if they do not, you may remove them. Please contact your doctor immediately if: You develop a fever You have worsening pain or swelling You have redness that goes up and down your arm You have pus draining from your insertion site You are bleeding large amounts   Encourage healthy lifestyle choices, including diet (rich in fruits, vegetables, and lean proteins, and low in salt and simple carbohydrates) and exercise (at least 30 minutes of moderate physical activity daily).     The above assessment and management plan was discussed with the patient. The patient verbalized understanding of and has agreed to the management plan. Patient is aware to call the clinic if they develop any new symptoms or if symptoms persist or worsen. Patient is aware when to return to the clinic for a follow-up visit. Patient educated on when it is appropriate to go to the emergency department.  Return for for Nexplanon  insertion.   Tyrihanna Wingert [place] Thompson, DNP [facility] [address] [place], [place] [address] [phone]  Note: This document was prepared by [employer] voice dictation technology and any errors that results from this process are unintentional.

## 2024-01-09 ENCOUNTER — Other Ambulatory Visit: Payer: Self-pay

## 2024-01-09 DIAGNOSIS — E785 Hyperlipidemia, unspecified: Secondary | ICD-10-CM

## 2024-01-09 LAB — LIPID PANEL
Cholesterol, Total: 149 mg/dL (ref 100–199)
HDL: 40 mg/dL (ref >39)
LDL CALC COMMENT:: 3.7 ratio (ref 0.0–4.4)
LDL Chol Calc (NIH): 70 mg/dL (ref 0–99)
Triglycerides: 239 mg/dL — ABNORMAL HIGH (ref 0–149)
VLDL Cholesterol Cal: 39 mg/dL (ref 5–40)

## 2024-01-09 LAB — CMP14+EGFR
ALT: 11 IU/L (ref 0–32)
AST: 15 IU/L (ref 0–40)
Albumin: 4.2 g/dL (ref 3.9–4.9)
Alkaline Phosphatase: 139 IU/L — ABNORMAL HIGH (ref 44–121)
BUN/Creatinine Ratio: 15 (ref 9–23)
BUN: 12 mg/dL (ref 6–20)
Bilirubin Total: 0.9 mg/dL (ref 0.0–1.2)
CO2: 20 mmol/L (ref 20–29)
Calcium: 9 mg/dL (ref 8.7–10.2)
Chloride: 99 mmol/L (ref 96–106)
Creatinine, Ser: 0.78 mg/dL (ref 0.57–1.00)
Globulin, Total: 2.7 g/dL (ref 1.5–4.5)
Glucose: 88 mg/dL (ref 70–99)
Potassium: 3.7 mmol/L (ref 3.5–5.2)
Sodium: 136 mmol/L (ref 134–144)
Total Protein: 6.9 g/dL (ref 6.0–8.5)
eGFR: 101 mL/min/{1.73_m2} (ref >59)

## 2024-01-09 LAB — CBC WITH DIFFERENTIAL/PLATELET
Basophils Absolute: 0 10*3/uL (ref 0.0–0.2)
Basos: 1 %
EOS (ABSOLUTE): 0.1 10*3/uL (ref 0.0–0.4)
Eos: 1 %
Hematocrit: 42.5 % (ref 34.0–46.6)
Hemoglobin: 14.1 g/dL (ref 11.1–15.9)
Immature Grans (Abs): 0 10*3/uL (ref 0.0–0.1)
Immature Granulocytes: 0 %
Lymphocytes Absolute: 2.3 10*3/uL (ref 0.7–3.1)
Lymphs: 34 %
MCH: 33.6 pg — ABNORMAL HIGH (ref 26.6–33.0)
MCHC: 33.2 g/dL (ref 31.5–35.7)
MCV: 101 fL — ABNORMAL HIGH (ref 79–97)
Monocytes Absolute: 0.4 10*3/uL (ref 0.1–0.9)
Monocytes: 6 %
Neutrophils Absolute: 3.7 10*3/uL (ref 1.4–7.0)
Neutrophils: 58 %
Platelets: 252 10*3/uL (ref 150–450)
RBC: 4.2 x10E6/uL (ref 3.77–5.28)
RDW: 12.4 % (ref 11.7–15.4)
WBC: 6.6 10*3/uL (ref 3.4–10.8)

## 2024-01-09 LAB — THYROID PANEL WITH TSH
Free Thyroxine Index: 2.6 (ref 1.2–4.9)
T3 Uptake Ratio: 28 % (ref 24–39)
T4, Total: 9.4 ug/dL (ref 4.5–12.0)
TSH: 0.699 u[IU]/mL (ref 0.450–4.500)

## 2024-01-09 MED ORDER — FENOFIBRATE 48 MG PO TABS: 48.0000 mg | ORAL_TABLET | Freq: Every day | ORAL | 1 refills | Status: AC

## 2024-01-09 NOTE — Progress Notes (Deleted)
 Established Patient Office Visit  Subjective  Patient ID: Tonya Scott, female    DOB: 07-30-1986  Age: 38 y.o. MRN: [id number]  No chief complaint on file.   HPI [place] Patient Active Problem List   Diagnosis Date Noted   Encounter for general adult medical examination with abnormal findings 01/08/2024   Family history of hypertension 01/08/2024   Overweight with body mass index (BMI) of 29 to 29.9 in adult 01/08/2024   Indication for care in labor or delivery 09/02/2019   Supervision of low-risk pregnancy 04/24/2019   TOBACCO USER 10/31/2009   Goiter 10/03/2007   Past Medical History:  Diagnosis Date   Anxiety    Tobacco abuse    Past Surgical History:  Procedure Laterality Date   iimplanon  03/15/2011   NO PAST SURGERIES     Social History   Tobacco Use   Smoking status: Every Day    Current packs/day: 0.20    Average packs/day: 0.2 packs/day for 5.0 years (1.0 ttl pk-yrs)    Types: Cigarettes   Smokeless tobacco: Never  Vaping Use   Vaping status: Never Used  Substance Use Topics   Alcohol use: Yes    Alcohol/week: 4.0 standard drinks of alcohol    Types: 4 Cans of beer per week    Comment: "liqour couple weekends a month"   Drug use: Yes    Types: Marijuana   Social History   Socioeconomic History   Marital status: Single    Spouse name: Not on file   Number of children: Not on file   Years of education: Not on file   Highest education level: 12th grade  Occupational History   Not on file  Tobacco Use   Smoking status: Every Day    Current packs/day: 0.20    Average packs/day: 0.2 packs/day for 5.0 years (1.0 ttl pk-yrs)    Types: Cigarettes   Smokeless tobacco: Never  Vaping Use   Vaping status: Never Used  Substance and Sexual Activity   Alcohol use: Yes    Alcohol/week: 4.0 standard drinks of alcohol    Types: 4 Cans of beer per week    Comment: "liqour couple weekends a month"   Drug use: Yes    Types: Marijuana   Sexual  activity: Yes    Partners: Male  Other Topics Concern   Not on file  Social History Narrative   ** Merged History Encounter **       Social Drivers of Health   Financial Resource Strain: Low Risk  (01/04/2024)   Overall Financial Resource Strain (CARDIA)    Difficulty of Paying Living Expenses: Not hard at all  Food Insecurity: No Food Insecurity (01/04/2024)   Hunger Vital Sign    Worried About Running Out of Food in the Last Year: Never true    Ran Out of Food in the Last Year: Never true  Transportation Needs: Unmet Transportation Needs (01/04/2024)   PRAPARE - Administrator, Civil Service (Medical): Yes    Lack of Transportation (Non-Medical): No  Physical Activity: Unknown (01/04/2024)   Exercise Vital Sign    Days of Exercise per Week: 0 days    Minutes of Exercise per Session: Not on file  Stress: Stress Concern Present (01/04/2024)   [employer] of Occupational Health - Occupational Stress Questionnaire    Feeling of Stress : To some extent  Social Connections: Socially Isolated (01/04/2024)   Social Connection and Isolation Panel [NHANES]  Frequency of Communication with Friends and Family: More than three times a week    Frequency of Social Gatherings with Friends and Family: Twice a week    Attends Religious Services: Never    Database administrator or Organizations: No    Attends Engineer, structural: Not on file    Marital Status: Never married  Catering manager Violence: Not on file   Family Status  Relation Name Status   Mother  Alive   Father  Alive  No partnership data on file   Family History  Problem Relation Age of Onset   Healthy Mother    Healthy Father    Allergies  Allergen Reactions   Penicillins Anaphylaxis    Did it involve swelling of the face/tongue/throat, SOB, or low BP? Yes Did it involve sudden or severe rash/hives, skin peeling, or any reaction on the inside of your mouth or nose? Yes Did you need to seek medical  attention at a hospital or doctor's office? Yes When did it last happen?       If all above answers are "NO", may proceed with cephalosporin use.      ROS Negative unless indicated in HPI   Objective:     There were no vitals taken for this visit. BP Readings from Last 3 Encounters:  01/08/24 121/82  10/03/21 110/70  09/04/19 106/70   Wt Readings from Last 3 Encounters:  01/08/24 195 lb 12.8 oz (88.8 kg)  10/03/21 207 lb 12.8 oz (94.3 kg)  09/02/19 214 lb 9.6 oz (97.3 kg)      Physical Exam   No results found for any visits on 01/16/24.  Last CBC Lab Results  Component Value Date   WBC 6.6 01/08/2024   HGB 14.1 01/08/2024   HCT 42.5 01/08/2024   MCV 101 (H) 01/08/2024   MCH 33.6 (H) 01/08/2024   RDW 12.4 01/08/2024   PLT 252 01/08/2024   Last metabolic panel Lab Results  Component Value Date   GLUCOSE 88 01/08/2024   NA 136 01/08/2024   K 3.7 01/08/2024   CL 99 01/08/2024   CO2 20 01/08/2024   BUN 12 01/08/2024   CREATININE 0.78 01/08/2024   EGFR 101 01/08/2024   CALCIUM 9.0 01/08/2024   PROT 6.9 01/08/2024   ALBUMIN 4.2 01/08/2024   LABGLOB 2.7 01/08/2024   BILITOT 0.9 01/08/2024   ALKPHOS 139 (H) 01/08/2024   AST 15 01/08/2024   ALT 11 01/08/2024   ANIONGAP 10 09/03/2019   Last lipids Lab Results  Component Value Date   CHOL 149 01/08/2024   HDL 40 01/08/2024   LDLCALC 70 01/08/2024   TRIG 239 (H) 01/08/2024   CHOLHDL 3.7 01/08/2024   Last hemoglobin A1c No results found for: "HGBA1C" Last thyroid functions Lab Results  Component Value Date   TSH 0.699 01/08/2024   T4TOTAL 9.4 01/08/2024        Assessment & Plan:  There are no diagnoses linked to this encounter. Continue healthy lifestyle choices, including diet (rich in fruits, vegetables, and lean proteins, and low in salt and simple carbohydrates) and exercise (at least 30 minutes of moderate physical activity daily).     The above assessment and management plan was discussed  with the patient. The patient verbalized understanding of and has agreed to the management plan. Patient is aware to call the clinic if they develop any new symptoms or if symptoms persist or worsen. Patient is aware when to return to the clinic  for a follow-up visit. Patient educated on when it is appropriate to go to the emergency department.  No follow-ups on file.    Arrie Aran Santa Lighter, [place] [facility] [address] [place], [place] [address] [phone]    Note: This document was prepared by [employer] voice dictation technology and any errors that results from this process are unintentional.

## 2024-01-16 ENCOUNTER — Ambulatory Visit: Admitting: Nurse Practitioner

## 2024-01-16 ENCOUNTER — Ambulatory Visit: Payer: Self-pay | Admitting: Nurse Practitioner

## 2024-01-16 ENCOUNTER — Telehealth (INDEPENDENT_AMBULATORY_CARE_PROVIDER_SITE_OTHER): Admitting: Family Medicine

## 2024-01-16 DIAGNOSIS — L292 Pruritus vulvae: Secondary | ICD-10-CM

## 2024-01-16 MED ORDER — FLUCONAZOLE 150 MG PO TABS: 150.0000 mg | ORAL_TABLET | Freq: Every day | ORAL | 0 refills | Status: AC

## 2024-01-16 NOTE — Telephone Encounter (Signed)
  Chief Complaint: Vaginal itching Symptoms: itching Frequency: Began yesterday Pertinent Negatives: Patient denies fever, discharge Disposition: [] ED /[] Urgent Care (no appt availability in office) / [x] Appointment(In office/virtual)/ []  [facility]/ [] Home Care/ [] Refused Recommended Disposition /[] [facility]/ []  Follow-up with PCP Additional Notes: Pt reports she was recently placed on antibiotics and developed severe vaginal itching yesterday. Pt denies discharge at this time. Virtual OV scheduled today. This RN educated pt on home care, new-worsening symptoms, when to call back/seek emergent care. Pt verbalized understanding and agrees to plan.   Reason for Disposition  MODERATE-SEVERE itching (i.e., interferes with school, work, or sleep)  Answer Assessment - Initial Assessment Questions 1. SYMPTOM: "What's the main symptom you're concerned about?" (e.g., pain, itching, dryness)     Vaginal itching 2. LOCATION: "Where is the itching located?" (e.g., inside/outside, left/right)     Inside 3. ONSET: "When did the  vaginal itching  start?"     Yesterday 4. PAIN: "Is there any pain?" If Yes, ask: "How bad is it?" (Scale: 1-10; mild, moderate, severe)   -  MILD (1-3): Doesn't interfere with normal activities.    -  MODERATE (4-7): Interferes with normal activities (e.g., work or school) or awakens from sleep.     -  SEVERE (8-10): Excruciating pain, unable to do any normal activities.     From scratching it's uncomfortable 5. ITCHING: "Is there any itching?" If Yes, ask: "How bad is it?" (Scale: 1-10; mild, moderate, severe)     Severe 6. CAUSE: "What do you think is causing the discharge?" "Have you had the same problem before? What happened then?"     Recently on antibiotics 7. OTHER SYMPTOMS: "Do you have any other symptoms?" (e.g., fever, itching, vaginal bleeding, pain with urination, injury to genital area, vaginal foreign body)     None  Protocols used:  Vaginal Symptoms-A-AH

## 2024-01-16 NOTE — Telephone Encounter (Signed)
 Call attempt 2. Mailbox is full and cannot leave a message  Message from Priscille Loveless sent at 01/16/2024  4:43 PM EDT  Summary: Request for medication   Copied From CRM [id number]. Reason for Triage:Pt has been taking antibiotics and is now getting a "yeast infection" and was wanting the dr to call in something for it. She stated that she is very uncomfortable. Please call pt and advise,

## 2024-01-16 NOTE — Progress Notes (Signed)
 Virtual Visit via Video Note  I connected with Tonya Scott  on 01/16/24 at  6:20 PM EDT by a video enabled telemedicine application and verified that I am speaking with the correct person using two identifiers.  Location patient: [place] Location provider:work or home office Persons participating in the virtual visit: patient, provider  I discussed the limitations and requested verbal permission for telemedicine visit. The patient expressed understanding and agreed to proceed.   HPI:  Acute telemedicine visit for "yeast infection": -Onset: started yesterday while finishing up antibiotic for dental work -has had this before and is sure is a yeast infection -Symptoms include: vulvovag pruitis, white curdy discharge -Denies: fevers, malaise, bad/or pelvic  -reports rarely sexually active and denies an concerns for STI or pregnancy -FDLMP: yesterday was last day of menstrual cycle -Has tried: nothing because none of the over the counter creams have worked for her in the past -Pertinent past medical history: see below -Pertinent medication allergies: Allergies  Allergen Reactions   Penicillins Anaphylaxis    Did it involve swelling of the face/tongue/throat, SOB, or low BP? Yes Did it involve sudden or severe rash/hives, skin peeling, or any reaction on the inside of your mouth or nose? Yes Did you need to seek medical attention at a hospital or doctor's office? Yes When did it last happen?       If all above answers are "NO", may proceed with cephalosporin use.   -COVID-19 vaccine status:  Immunization History  Administered Date(s) Administered   Tdap 04/19/2011, 07/08/2019     ROS: See pertinent positives and negatives per HPI.  Past Medical History:  Diagnosis Date   Anxiety    Tobacco abuse     Past Surgical History:  Procedure Laterality Date   iimplanon  03/15/2011   NO PAST SURGERIES       Current Outpatient Medications:    fluconazole (DIFLUCAN) 150 MG tablet, Take 1  tablet (150 mg total) by mouth daily., Disp: 1 tablet, Rfl: 0   fenofibrate (TRICOR) 48 MG tablet, Take 1 tablet (48 mg total) by mouth daily., Disp: 90 tablet, Rfl: 1  EXAM:  VITALS per patient if applicable:  GENERAL: alert, oriented, appears well and in no acute distress  HEENT: atraumatic, conjunttiva clear, no obvious abnormalities on inspection of external nose and ears  NECK: normal movements of the head and neck  LUNGS: on inspection no signs of respiratory distress, breathing rate appears normal, no obvious gross SOB, gasping or wheezing  CV: no obvious cyanosis  MS: moves all visible extremities without noticeable abnormality  PSYCH/NEURO: pleasant and cooperative, no obvious depression or anxiety, speech and thought processing grossly intact  ASSESSMENT AND PLAN:  Discussed the following assessment and plan:  Vulvovaginal pruritus  -we discussed possible serious and likely etiologies, options for evaluation and workup, limitations of telemedicine visit vs in person visit, treatment, treatment risks and precautions. Pt prefers treatment via telemedicine at this moment. She is requesting diflucan for what she feels is definitely a yeast infection and reports has not felt that OTC creams worked in the past. After length discussion of alt dx, workup, limitations of empiric tx and risks she opted for tx with diflucan and agrees to schedule close inperson follow up as well for inperson exam to ensure resolution and no other dx, specially if symptoms do not resolve completely with treatment.  Discussed options for follow up care. Did let this patient know that I do telemedicine on Tuesdays and Thursdays for [place] and  those are the days I am logged into the system. Advised to schedule follow up visit with PCP, [place] virtual visits or [facility] if any further urgent questions or concerns to avoid delays in care.   I discussed the assessment and treatment plan with the patient. The  patient was provided an opportunity to ask questions and all were answered. The patient agreed with the plan and demonstrated an understanding of the instructions.     Terressa Koyanagi, DO

## 2024-01-16 NOTE — Patient Instructions (Signed)
-  I sent the medication(s) we discussed to your pharmacy: Meds ordered this encounter  Medications   fluconazole (DIFLUCAN) 150 MG tablet    Sig: Take 1 tablet (150 mg total) by mouth daily.    Dispense:  1 tablet    Refill:  0    Schedule inperson follow up care in the next 1 week, sooner if any worsening or new symptoms.   I hope you are feeling better soon!  Seek in person care promptly if your symptoms worsen, new concerns arise or you are not improving with treatment.  It was nice to meet you today. I help [facility] out with telemedicine visits on Tuesdays and Thursdays and am happy to help if you need a virtual follow up visit on those days. Otherwise, if you have any concerns or questions following this visit please schedule a follow up visit with your Primary Care office or seek care at a local urgent care clinic to avoid delays in care. If you are having severe or life threatening symptoms please call 911 and/or go to the nearest emergency room.

## 2024-01-17 ENCOUNTER — Encounter: Payer: Self-pay | Admitting: Nurse Practitioner

## 2024-01-22 ENCOUNTER — Encounter: Payer: Self-pay | Admitting: Nurse Practitioner

## 2024-01-22 ENCOUNTER — Ambulatory Visit: Admitting: Nurse Practitioner

## 2024-01-22 NOTE — Progress Notes (Deleted)
 Established Patient Office Visit  Subjective  Patient ID: Tonya Scott, female    DOB: 11-14-1985  Age: 38 y.o. MRN: [id number]  No chief complaint on file.   HPI Tonya Scott Patient Active Problem List   Diagnosis Date Noted  . Encounter for general adult medical examination with abnormal findings 01/08/2024  . Family history of hypertension 01/08/2024  . Overweight with body mass index (BMI) of 29 to 29.9 in adult 01/08/2024  . Indication for care in labor or delivery 09/02/2019  . Supervision of low-risk pregnancy 04/24/2019  . TOBACCO USER 10/31/2009  . Goiter 10/03/2007   Past Medical History:  Diagnosis Date  . Anxiety   . Tobacco abuse    Past Surgical History:  Procedure Laterality Date  . iimplanon  03/15/2011  . NO PAST SURGERIES     Social History   Tobacco Use  . Smoking status: Every Day    Current packs/day: 0.20    Average packs/day: 0.2 packs/day for 5.0 years (1.0 ttl pk-yrs)    Types: Cigarettes  . Smokeless tobacco: Never  Vaping Use  . Vaping status: Never Used  Substance Use Topics  . Alcohol use: Yes    Alcohol/week: 4.0 standard drinks of alcohol    Types: 4 Cans of beer per week    Comment: "liqour couple weekends a month"  . Drug use: Yes    Types: Marijuana   Social History   Socioeconomic History  . Marital status: Single    Spouse name: Not on file  . Number of children: Not on file  . Years of education: Not on file  . Highest education level: 12th grade  Occupational History  . Not on file  Tobacco Use  . Smoking status: Every Day    Current packs/day: 0.20    Average packs/day: 0.2 packs/day for 5.0 years (1.0 ttl pk-yrs)    Types: Cigarettes  . Smokeless tobacco: Never  Vaping Use  . Vaping status: Never Used  Substance and Sexual Activity  . Alcohol use: Yes    Alcohol/week: 4.0 standard drinks of alcohol    Types: 4 Cans of beer per week    Comment: "liqour couple weekends a month"  . Drug use: Yes     Types: Marijuana  . Sexual activity: Yes    Partners: Male  Other Topics Concern  . Not on file  Social History Narrative   ** Merged History Encounter **       Social Drivers of Health   Financial Resource Strain: Low Risk  (01/04/2024)   Overall Financial Resource Strain (CARDIA)   . Difficulty of Paying Living Expenses: Not hard at all  Food Insecurity: No Food Insecurity (01/04/2024)   Hunger Vital Sign   . Worried About Programme researcher, broadcasting/film/video in the Last Year: Never true   . Ran Out of Food in the Last Year: Never true  Transportation Needs: Unmet Transportation Needs (01/04/2024)   PRAPARE - Transportation   . Lack of Transportation (Medical): Yes   . Lack of Transportation (Non-Medical): No  Physical Activity: Unknown (01/04/2024)   Exercise Vital Sign   . Days of Exercise per Week: 0 days   . Minutes of Exercise per Session: Not on file  Stress: Stress Concern Present (01/04/2024)   [employer] of Occupational Health - Occupational Stress Questionnaire   . Feeling of Stress : To some extent  Social Connections: Socially Isolated (01/04/2024)   Social Connection and Isolation Panel [NHANES]   .  Frequency of Communication with Friends and Family: More than three times a week   . Frequency of Social Gatherings with Friends and Family: Twice a week   . Attends Religious Services: Never   . Active Member of Clubs or Organizations: No   . Attends Banker Meetings: Not on file   . Marital Status: Never married  Intimate Partner Violence: Not on file   Family Status  Relation Name Status  . Mother  Alive  . Father  Alive  No partnership data on file   Family History  Problem Relation Age of Onset  . Healthy Mother   . Healthy Father    Allergies  Allergen Reactions  . Penicillins Anaphylaxis    Did it involve swelling of the face/tongue/throat, SOB, or low BP? Yes Did it involve sudden or severe rash/hives, skin peeling, or any reaction on the inside of your  mouth or nose? Yes Did you need to seek medical attention at a hospital or doctor's office? Yes When did it last happen?       If all above answers are "NO", may proceed with cephalosporin use.      ROS Negative unless indicated in HPI   Objective:     There were no vitals taken for this visit. BP Readings from Last 3 Encounters:  01/08/24 121/82  10/03/21 110/70  09/04/19 106/70   Wt Readings from Last 3 Encounters:  01/08/24 195 lb 12.8 oz (88.8 kg)  10/03/21 207 lb 12.8 oz (94.3 kg)  09/02/19 214 lb 9.6 oz (97.3 kg)      Physical Exam   No results found for any visits on 01/22/24.  Last CBC Lab Results  Component Value Date   WBC 6.6 01/08/2024   HGB 14.1 01/08/2024   HCT 42.5 01/08/2024   MCV 101 (H) 01/08/2024   MCH 33.6 (H) 01/08/2024   RDW 12.4 01/08/2024   PLT 252 01/08/2024   Last metabolic panel Lab Results  Component Value Date   GLUCOSE 88 01/08/2024   NA 136 01/08/2024   K 3.7 01/08/2024   CL 99 01/08/2024   CO2 20 01/08/2024   BUN 12 01/08/2024   CREATININE 0.78 01/08/2024   EGFR 101 01/08/2024   CALCIUM 9.0 01/08/2024   PROT 6.9 01/08/2024   ALBUMIN 4.2 01/08/2024   LABGLOB 2.7 01/08/2024   BILITOT 0.9 01/08/2024   ALKPHOS 139 (H) 01/08/2024   AST 15 01/08/2024   ALT 11 01/08/2024   ANIONGAP 10 09/03/2019   Last lipids Lab Results  Component Value Date   CHOL 149 01/08/2024   HDL 40 01/08/2024   LDLCALC 70 01/08/2024   TRIG 239 (H) 01/08/2024   CHOLHDL 3.7 01/08/2024   Last hemoglobin A1c No results found for: "HGBA1C" Last thyroid functions Lab Results  Component Value Date   TSH 0.699 01/08/2024   T4TOTAL 9.4 01/08/2024        Assessment & Plan:  Encounter for initial prescription of implantable subdermal contraceptive   Continue healthy lifestyle choices, including diet (rich in fruits, vegetables, and lean proteins, and low in salt and simple carbohydrates) and exercise (at least 30 minutes of moderate physical  activity daily).     The above assessment and management plan was discussed with the patient. The patient verbalized understanding of and has agreed to the management plan. Patient is aware to call the clinic if they develop any new symptoms or if symptoms persist or worsen. Patient is aware when to return to  the clinic for a follow-up visit. Patient educated on when it is appropriate to go to the emergency department.  No follow-ups on file.    Arrie Aran Santa Lighter, [facility] [address] [place], [place] [address] [phone]    Note: This document was prepared by [employer] voice dictation technology and any errors that results from this process are unintentional.

## 2024-02-19 ENCOUNTER — Ambulatory Visit

## 2024-02-19 VITALS — BP 121/82 | HR 78 | Ht 68.0 in | Wt 195.0 lb

## 2024-02-19 DIAGNOSIS — Z0000 Encounter for general adult medical examination without abnormal findings: Secondary | ICD-10-CM | POA: Diagnosis not present

## 2024-02-19 NOTE — Progress Notes (Signed)
 Subjective:   Tonya Scott is a 38 y.o. who presents for a Medicare Wellness preventive visit.  Visit Complete: Virtual I connected with  Tonya Scott on 02/19/24 by a audio enabled telemedicine application and verified that I am speaking with the correct person using two identifiers.  Patient Location: Home  Provider Location: Home Office  I discussed the limitations of evaluation and management by telemedicine. The patient expressed understanding and agreed to proceed.  Vital Signs: Because this visit was a virtual/telehealth visit, some criteria may be missing or patient reported. Any vitals not documented were not able to be obtained and vitals that have been documented are patient reported.  VideoDeclined- This patient declined Librarian, academic. Therefore the visit was completed with audio only.  Persons Participating in Visit: Patient.  AWV Questionnaire: No: Patient Medicare AWV questionnaire was not completed prior to this visit.  Cardiac Risk Factors include: hypertension;obesity (BMI >30kg/m2);smoking/ tobacco exposure     Objective:    Today's Vitals   02/19/24 1310  BP: 121/82  Pulse: 78  Weight: 195 lb (88.5 kg)  Height: 5\' 8"  (1.727 m)   Body mass index is 29.65 kg/m.     02/19/2024    1:21 PM 09/02/2019   11:27 AM  Advanced Directives  Does Patient Have a Medical Advance Directive? No No  Would patient like information on creating a medical advance directive?  No - Patient declined    Current Medications (verified) Outpatient Encounter Medications as of 02/19/2024  Medication Sig   fenofibrate  (TRICOR ) 48 MG tablet Take 1 tablet (48 mg total) by mouth daily.   fluconazole  (DIFLUCAN ) 150 MG tablet Take 1 tablet (150 mg total) by mouth daily. (Patient not taking: Reported on 02/19/2024)   No facility-administered encounter medications on file as of 02/19/2024.    Allergies (verified) Penicillins   History: Past  Medical History:  Diagnosis Date   Anxiety    Tobacco abuse    Past Surgical History:  Procedure Laterality Date   iimplanon  03/15/2011   NO PAST SURGERIES     Family History  Problem Relation Age of Onset   Healthy Mother    Healthy Father    Social History   Socioeconomic History   Marital status: Single    Spouse name: Not on file   Number of children: Not on file   Years of education: Not on file   Highest education level: 12th grade  Occupational History   Not on file  Tobacco Use   Smoking status: Every Day    Current packs/day: 0.20    Average packs/day: 0.2 packs/day for 5.0 years (1.0 ttl pk-yrs)    Types: Cigarettes   Smokeless tobacco: Never  Vaping Use   Vaping status: Never Used  Substance and Sexual Activity   Alcohol use: Yes    Alcohol/week: 4.0 standard drinks of alcohol    Types: 4 Cans of beer per week    Comment: "liqour couple weekends a month"   Drug use: Yes    Types: Marijuana   Sexual activity: Yes    Partners: Male  Other Topics Concern   Not on file  Social History Narrative   ** Merged History Encounter **       Social Drivers of Health   Financial Resource Strain: Low Risk  (02/19/2024)   Overall Financial Resource Strain (CARDIA)    Difficulty of Paying Living Expenses: Not hard at all  Food Insecurity: No Food Insecurity (  02/19/2024)   Hunger Vital Sign    Worried About Running Out of Food in the Last Year: Never true    Ran Out of Food in the Last Year: Never true  Transportation Needs: No Transportation Needs (02/19/2024)   PRAPARE - Administrator, Civil Service (Medical): No    Lack of Transportation (Non-Medical): No  Recent Concern: Transportation Needs - Unmet Transportation Needs (01/04/2024)   PRAPARE - Administrator, Civil Service (Medical): Yes    Lack of Transportation (Non-Medical): No  Physical Activity: Sufficiently Active (02/19/2024)   Exercise Vital Sign    Days of Exercise per Week: 3  days    Minutes of Exercise per Session: 150+ min  Stress: No Stress Concern Present (02/19/2024)   [employer] of Occupational Health - Occupational Stress Questionnaire    Feeling of Stress : Not at all  Recent Concern: Stress - Stress Concern Present (01/04/2024)   [employer] of Occupational Health - Occupational Stress Questionnaire    Feeling of Stress : To some extent  Social Connections: Socially Isolated (02/19/2024)   Social Connection and Isolation Panel [NHANES]    Frequency of Communication with Friends and Family: More than three times a week    Frequency of Social Gatherings with Friends and Family: More than three times a week    Attends Religious Services: Never    Database administrator or Organizations: No    Attends Engineer, structural: Never    Marital Status: Never married    Tobacco Counseling Ready to quit: Not Answered Counseling given: Yes    Clinical Intake:  Pre-visit preparation completed: Yes  Pain : No/denies pain     BMI - recorded: 29.65 Nutritional Status: BMI 25 -29 Overweight Nutritional Risks: None Diabetes: No  No results found for: "HGBA1C"   How often do you need to have someone help you when you read instructions, pamphlets, or other written materials from your doctor or pharmacy?: 1 - Never  Interpreter Needed?: No  Information entered by :: Alia t/cma   Activities of Daily Living     02/19/2024    1:19 PM  In your present state of health, do you have any difficulty performing the following activities:  Hearing? 0  Vision? 1  Difficulty concentrating or making decisions? 0  Walking or climbing stairs? 0  Dressing or bathing? 0  Doing errands, shopping? 0  Preparing Food and eating ? N  Using the Toilet? N  In the past six months, have you accidently leaked urine? Y  Do you have problems with loss of bowel control? N  Managing your Medications? N  Managing your Finances? N  Housekeeping or  managing your Housekeeping? N    Patient Care Team: [place] Greaser, Adell Hones, NP as PCP - General (Nurse Practitioner)  Indicate any recent Medical Services you may have received from other than [facility] providers in the past year (date may be approximate).     Assessment:   This is a routine wellness examination for Tonya.  Hearing/Vision screen Hearing Screening - Comments:: Pt denies hearing dif  Vision Screening - Comments:: Pt has a little bit of hard time w/distance, will make an eye apt   Goals Addressed             This Visit's Progress    Patient Stated       Pt stated been going to gym lately       Depression Screen  02/19/2024    1:41 PM 01/08/2024   11:46 AM 10/03/2021    4:24 PM 08/04/2019    8:44 AM 03/19/2019   10:01 AM 02/19/2019   10:38 AM  PHQ 2/9 Scores  PHQ - 2 Score 2 2 0 0 0 0  PHQ- 9 Score 10 10 3        Fall Risk     02/19/2024    1:28 PM 01/08/2024   11:46 AM 08/04/2019    8:44 AM 02/19/2019   10:37 AM  Fall Risk   Falls in the past year? 0 0 0 0  Number falls in past yr: 0 0 0   Injury with Fall? 0 0    Risk for fall due to : No Fall Risks No Fall Risks    Follow up Falls prevention discussed;Falls evaluation completed Falls evaluation completed      MEDICARE RISK AT HOME:  Medicare Risk at Home Any stairs in or around the home?: No If so, are there any without handrails?: No Home free of loose throw rugs in walkways, pet beds, electrical cords, etc?: Yes Adequate lighting in your home to reduce risk of falls?: Yes Life alert?: No Use of a cane, walker or w/c?: No Grab bars in the bathroom?: Yes Shower chair or bench in shower?: No Elevated toilet seat or a handicapped toilet?: Yes  TIMED UP AND GO:  Was the test performed?  no  Cognitive Function: 6CIT completed        02/19/2024    1:26 PM  6CIT Screen  What Year? 0 points  What month? 0 points  What time? 3 points  Count back from 20 0 points  Months in reverse 0  points  Repeat phrase 4 points  Total Score 7 points    Immunizations Immunization History  Administered Date(s) Administered   Tdap 04/19/2011, 07/08/2019    Screening Tests Health Maintenance  Topic Date Due   Pneumococcal Vaccine 26-78 Years old (1 of 2 - PCV) 01/07/2025 (Originally 06/10/2006)   COVID-19 Vaccine (1 - 2024-25 season) 03/06/2025 (Originally 06/30/2023)   INFLUENZA VACCINE  05/29/2024   Medicare Annual Wellness (AWV)  02/18/2025   Cervical Cancer Screening (HPV/Pap Cotest)  10/03/2026   DTaP/Tdap/Td (3 - Td or Tdap) 07/07/2029   Hepatitis C Screening  Completed   HIV Screening  Completed   HPV VACCINES  Aged Out   Meningococcal B Vaccine  Aged Out    Health Maintenance  There are no preventive care reminders to display for this patient.  Health Maintenance Items Addressed: See Nurse Notes  Additional Screening:  Vision Screening: Recommended annual ophthalmology exams for early detection of glaucoma and other disorders of the eye.  Dental Screening: Recommended annual dental exams for proper oral hygiene  Community Resource Referral / Chronic Care Management: CRR required this visit?  No   CCM required this visit?  No     Plan:     I have personally reviewed and noted the following in the patient's chart:   Medical and social history Use of alcohol, tobacco or illicit drugs  Current medications and supplements including opioid prescriptions. Patient is not currently taking opioid prescriptions. Functional ability and status Nutritional status Physical activity Advanced directives List of other physicians Hospitalizations, surgeries, and ER visits in previous 12 months Vitals Screenings to include cognitive, depression, and falls Referrals and appointments  In addition, I have reviewed and discussed with patient certain preventive protocols, quality metrics, and best practice recommendations. A  written personalized care plan for preventive  services as well as general preventive health recommendations were provided to patient.     Michaelle Adolphus, CMA   02/19/2024   After Visit Summary: (MyChart) Due to this being a telephonic visit, the after visit summary with patients personalized plan was offered to patient via MyChart   Notes: Nothing significant to report at this time.

## 2024-02-19 NOTE — Patient Instructions (Signed)
 Ms. Domine , Thank you for taking time to come for your Medicare Wellness Visit. I appreciate your ongoing commitment to your health goals. Please review the following plan we discussed and let me know if I can assist you in the future.   Referrals/Orders/Follow-Ups/Clinician Recommendations: n/a  This is a list of the screening recommended for you and due dates:  Health Maintenance  Topic Date Due   Pneumococcal Vaccination (1 of 2 - PCV) 01/07/2025*   COVID-19 Vaccine (1 - 2024-25 season) 03/06/2025*   Flu Shot  05/29/2024   Medicare Annual Wellness Visit  02/18/2025   Pap with HPV screening  10/03/2026   DTaP/Tdap/Td vaccine (3 - Td or Tdap) 07/07/2029   Hepatitis C Screening  Completed   HIV Screening  Completed   HPV Vaccine  Aged Out   Meningitis B Vaccine  Aged Out  *Topic was postponed. The date shown is not the original due date.    Advanced directives: (Declined) Advance directive discussed with you today. Even though you declined this today, please call our office should you change your mind, and we can give you the proper paperwork for you to fill out.  Next Medicare Annual Wellness Visit scheduled for next year: Yes

## 2024-02-25 ENCOUNTER — Ambulatory Visit: Admitting: Nurse Practitioner

## 2024-03-03 ENCOUNTER — Ambulatory Visit: Admitting: Nurse Practitioner

## 2024-03-26 ENCOUNTER — Encounter: Admitting: Nurse Practitioner

## 2024-06-18 ENCOUNTER — Telehealth: Payer: Self-pay | Admitting: Family Medicine

## 2024-06-18 NOTE — Telephone Encounter (Signed)
 Pt aware.

## 2024-06-18 NOTE — Telephone Encounter (Signed)
 I'm not taking on other provider's patients right now. I'm overwhelmed with new patients to the office and absorbing [facility] patients at this time.

## 2024-06-18 NOTE — Telephone Encounter (Signed)
 Copied from CRM #[id number]. Topic: Appointments - Transfer of Care >> Jun 18, 2024  9:01 AM Carlatta H wrote: Pt is requesting to transfer FROM: Nena Hummer Pt is requesting to transfer TO: Annabella Search Reason for requested transfer: No longer wants to be seen by current PCP It is the responsibility of the team the patient would like to transfer to (Dr. Annabella Search) to reach out to the patient if for any reason this transfer is not acceptable.

## 2024-07-20 ENCOUNTER — Telehealth: Payer: Self-pay

## 2024-07-20 NOTE — Telephone Encounter (Signed)
 Copied from CRM [id number]. Topic: Clinical - Medical Advice >> Jul 20, 2024 11:41 AM Thersia BROCKS wrote: Reason for CRM: Patient called in wanting to know if she can get an prescription for Bv, didn't know if she needed to be seen first before she is prescribed

## 2024-07-20 NOTE — Telephone Encounter (Signed)
 Scheduled appt.

## 2024-07-22 NOTE — Progress Notes (Deleted)
 Subjective:  Patient ID: Tonya Scott, female    DOB: [birth year], 38 y.o.   MRN: [id number]  Patient Care Team: Deitra Morton Sebastian Nena, NP as PCP - General (Nurse Practitioner)   Chief Complaint:  No chief complaint on file.   HPI: Tonya Scott is a 38 y.o. female presenting on 07/23/2024 for No chief complaint on file.   Discussed the use of AI scribe software for clinical note transcription with the patient, who gave verbal consent to proceed.  History of Present Illness       Relevant past medical, surgical, family, and social history reviewed and updated as indicated.  Allergies and medications reviewed and updated. Data reviewed: Chart in Epic.   Past Medical History:  Diagnosis Date   Anxiety    Tobacco abuse     Past Surgical History:  Procedure Laterality Date   iimplanon  03/15/2011   NO PAST SURGERIES      Social History   Socioeconomic History   Marital status: Single    Spouse name: Not on file   Number of children: Not on file   Years of education: Not on file   Highest education level: 12th grade  Occupational History   Not on file  Tobacco Use   Smoking status: Every Day    Current packs/day: 0.20    Average packs/day: 0.2 packs/day for 5.0 years (1.0 ttl pk-yrs)    Types: Cigarettes   Smokeless tobacco: Never  Vaping Use   Vaping status: Never Used  Substance and Sexual Activity   Alcohol use: Yes    Alcohol/week: 4.0 standard drinks of alcohol    Types: 4 Cans of beer per week    Comment: liqour couple weekends a month   Drug use: Yes    Types: Marijuana   Sexual activity: Yes    Partners: Male  Other Topics Concern   Not on file  Social History Narrative   ** Merged History Encounter **       Social Drivers of Health   Financial Resource Strain: Low Risk  (02/19/2024)   Overall Financial Resource Strain (CARDIA)    Difficulty of Paying Living Expenses: Not hard at all  Food Insecurity: No Food Insecurity  (02/19/2024)   Hunger Vital Sign    Worried About Running Out of Food in the Last Year: Never true    Ran Out of Food in the Last Year: Never true  Transportation Needs: No Transportation Needs (02/19/2024)   PRAPARE - Administrator, Civil Service (Medical): No    Lack of Transportation (Non-Medical): No  Recent Concern: Transportation Needs - Unmet Transportation Needs (01/04/2024)   PRAPARE - Administrator, Civil Service (Medical): Yes    Lack of Transportation (Non-Medical): No  Physical Activity: Sufficiently Active (02/19/2024)   Exercise Vital Sign    Days of Exercise per Week: 3 days    Minutes of Exercise per Session: 150+ min  Stress: No Stress Concern Present (02/19/2024)   [employer] of Occupational Health - Occupational Stress Questionnaire    Feeling of Stress : Not at all  Recent Concern: Stress - Stress Concern Present (01/04/2024)   [employer] of Occupational Health - Occupational Stress Questionnaire    Feeling of Stress : To some extent  Social Connections: Socially Isolated (02/19/2024)   Social Connection and Isolation Panel    Frequency of Communication with Friends and Family: More than three times a week  Frequency of Social Gatherings with Friends and Family: More than three times a week    Attends Religious Services: Never    Database administrator or Organizations: No    Attends Banker Meetings: Never    Marital Status: Never married  Intimate Partner Violence: Not At Risk (02/19/2024)   Humiliation, Afraid, Rape, and Kick questionnaire    Fear of Current or Ex-Partner: No    Emotionally Abused: No    Physically Abused: No    Sexually Abused: No    Outpatient Encounter Medications as of 07/23/2024  Medication Sig   fenofibrate  (TRICOR ) 48 MG tablet Take 1 tablet (48 mg total) by mouth daily.   fluconazole  (DIFLUCAN ) 150 MG tablet Take 1 tablet (150 mg total) by mouth daily. (Patient not taking: Reported on  02/19/2024)   No facility-administered encounter medications on file as of 07/23/2024.    Allergies  Allergen Reactions   Penicillins Anaphylaxis    Did it involve swelling of the face/tongue/throat, SOB, or low BP? Yes Did it involve sudden or severe rash/hives, skin peeling, or any reaction on the inside of your mouth or nose? Yes Did you need to seek medical attention at a hospital or doctor's office? Yes When did it last happen?       If all above answers are NO, may proceed with cephalosporin use.    Pertinent ROS per HPI, otherwise unremarkable      Objective:  There were no vitals taken for this visit.   Wt Readings from Last 3 Encounters:  02/19/24 195 lb (88.5 kg)  01/08/24 195 lb 12.8 oz (88.8 kg)  10/03/21 207 lb 12.8 oz (94.3 kg)    Physical Exam Physical Exam      Results for orders placed or performed in visit on 01/08/24  Pregnancy, urine   Collection Time: 01/08/24 12:08 PM  Result Value Ref Range   Preg Test, Ur Negative Negative  CBC with Differential/Platelet   Collection Time: 01/08/24 12:50 PM  Result Value Ref Range   WBC 6.6 3.4 - 10.8 x10E3/uL   RBC 4.20 3.77 - 5.28 x10E6/uL   Hemoglobin 14.1 11.1 - 15.9 g/dL   Hematocrit 57.4 65.9 - 46.6 %   MCV 101 (H) 79 - 97 fL   MCH 33.6 (H) 26.6 - 33.0 pg   MCHC 33.2 31.5 - 35.7 g/dL   RDW 87.5 88.2 - 84.5 %   Platelets 252 150 - 450 x10E3/uL   Neutrophils 58 Not Estab. %   Lymphs 34 Not Estab. %   Monocytes 6 Not Estab. %   Eos 1 Not Estab. %   Basos 1 Not Estab. %   Neutrophils Absolute 3.7 1.4 - 7.0 x10E3/uL   Lymphocytes Absolute 2.3 0.7 - 3.1 x10E3/uL   Monocytes Absolute 0.4 0.1 - 0.9 x10E3/uL   EOS (ABSOLUTE) 0.1 0.0 - 0.4 x10E3/uL   Basophils Absolute 0.0 0.0 - 0.2 x10E3/uL   Immature Granulocytes 0 Not Estab. %   Immature Grans (Abs) 0.0 0.0 - 0.1 x10E3/uL  CMP14+EGFR   Collection Time: 01/08/24 12:50 PM  Result Value Ref Range   Glucose 88 70 - 99 mg/dL   BUN 12 6 - 20 mg/dL    Creatinine, Ser 9.21 0.57 - 1.00 mg/dL   eGFR 898 >40 fO/fpw/8.26   BUN/Creatinine Ratio 15 9 - 23   Sodium 136 134 - 144 mmol/L   Potassium 3.7 3.5 - 5.2 mmol/L   Chloride 99 96 - 106 mmol/L  CO2 20 20 - 29 mmol/L   Calcium 9.0 8.7 - 10.2 mg/dL   Total Protein 6.9 6.0 - 8.5 g/dL   Albumin 4.2 3.9 - 4.9 g/dL   Globulin, Total 2.7 1.5 - 4.5 g/dL   Bilirubin Total 0.9 0.0 - 1.2 mg/dL   Alkaline Phosphatase 139 (H) 44 - 121 IU/L   AST 15 0 - 40 IU/L   ALT 11 0 - 32 IU/L  Lipid panel   Collection Time: 01/08/24 12:50 PM  Result Value Ref Range   Cholesterol, Total 149 100 - 199 mg/dL   Triglycerides 760 (H) 0 - 149 mg/dL   HDL 40 >60 mg/dL   VLDL Cholesterol Cal 39 5 - 40 mg/dL   LDL Chol Calc (NIH) 70 0 - 99 mg/dL   Chol/HDL Ratio 3.7 0.0 - 4.4 ratio  Thyroid  Panel With TSH   Collection Time: 01/08/24 12:50 PM  Result Value Ref Range   TSH 0.699 0.450 - 4.500 uIU/mL   T4, Total 9.4 4.5 - 12.0 ug/dL   T3 Uptake Ratio 28 24 - 39 %   Free Thyroxine Index 2.6 1.2 - 4.9       Pertinent labs & imaging results that were available during my care of the patient were reviewed by me and considered in my medical decision making.  Assessment & Plan:  There are no diagnoses linked to this encounter.   Assessment and Plan Assessment & Plan       Continue all other maintenance medications.  Follow up plan: No follow-ups on file.   Continue healthy lifestyle choices, including diet (rich in fruits, vegetables, and lean proteins, and low in salt and simple carbohydrates) and exercise (at least 30 minutes of moderate physical activity daily).  Educational handout given for ***  The above assessment and management plan was discussed with the patient. The patient verbalized understanding of and has agreed to the management plan. Patient is aware to call the clinic if they develop any new symptoms or if symptoms persist or worsen. Patient is aware when to return to the clinic for a  follow-up visit. Patient educated on when it is appropriate to go to the emergency department.  @SIGNATURE @

## 2024-07-23 ENCOUNTER — Ambulatory Visit: Admitting: Nurse Practitioner

## 2024-07-28 NOTE — Progress Notes (Deleted)
 Subjective:  Patient ID: Tonya Scott, female    DOB: [birth year], 38 y.o.   MRN: [id number]  Patient Care Team: Deitra Morton Sebastian Nena, NP as PCP - General (Nurse Practitioner)   Chief Complaint:  No chief complaint on file.   HPI: Tonya Scott is a 38 y.o. female presenting on 07/29/2024 for No chief complaint on file.   Discussed the use of AI scribe software for clinical note transcription with the patient, who gave verbal consent to proceed.  History of Present Illness       Relevant past medical, surgical, family, and social history reviewed and updated as indicated.  Allergies and medications reviewed and updated. Data reviewed: Chart in Epic.   Past Medical History:  Diagnosis Date   Anxiety    Tobacco abuse     Past Surgical History:  Procedure Laterality Date   iimplanon  03/15/2011   NO PAST SURGERIES      Social History   Socioeconomic History   Marital status: Single    Spouse name: Not on file   Number of children: Not on file   Years of education: Not on file   Highest education level: 12th grade  Occupational History   Not on file  Tobacco Use   Smoking status: Every Day    Current packs/day: 0.20    Average packs/day: 0.2 packs/day for 5.0 years (1.0 ttl pk-yrs)    Types: Cigarettes   Smokeless tobacco: Never  Vaping Use   Vaping status: Never Used  Substance and Sexual Activity   Alcohol use: Yes    Alcohol/week: 4.0 standard drinks of alcohol    Types: 4 Cans of beer per week    Comment: liqour couple weekends a month   Drug use: Yes    Types: Marijuana   Sexual activity: Yes    Partners: Male  Other Topics Concern   Not on file  Social History Narrative   ** Merged History Encounter **       Social Drivers of Health   Financial Resource Strain: Low Risk  (02/19/2024)   Overall Financial Resource Strain (CARDIA)    Difficulty of Paying Living Expenses: Not hard at all  Food Insecurity: No Food Insecurity  (02/19/2024)   Hunger Vital Sign    Worried About Running Out of Food in the Last Year: Never true    Ran Out of Food in the Last Year: Never true  Transportation Needs: No Transportation Needs (02/19/2024)   PRAPARE - Administrator, Civil Service (Medical): No    Lack of Transportation (Non-Medical): No  Recent Concern: Transportation Needs - Unmet Transportation Needs (01/04/2024)   PRAPARE - Administrator, Civil Service (Medical): Yes    Lack of Transportation (Non-Medical): No  Physical Activity: Sufficiently Active (02/19/2024)   Exercise Vital Sign    Days of Exercise per Week: 3 days    Minutes of Exercise per Session: 150+ min  Stress: No Stress Concern Present (02/19/2024)   [employer] of Occupational Health - Occupational Stress Questionnaire    Feeling of Stress : Not at all  Recent Concern: Stress - Stress Concern Present (01/04/2024)   [employer] of Occupational Health - Occupational Stress Questionnaire    Feeling of Stress : To some extent  Social Connections: Socially Isolated (02/19/2024)   Social Connection and Isolation Panel    Frequency of Communication with Friends and Family: More than three times a week  Frequency of Social Gatherings with Friends and Family: More than three times a week    Attends Religious Services: Never    Database administrator or Organizations: No    Attends Banker Meetings: Never    Marital Status: Never married  Intimate Partner Violence: Not At Risk (02/19/2024)   Humiliation, Afraid, Rape, and Kick questionnaire    Fear of Current or Ex-Partner: No    Emotionally Abused: No    Physically Abused: No    Sexually Abused: No    Outpatient Encounter Medications as of 07/29/2024  Medication Sig   fenofibrate  (TRICOR ) 48 MG tablet Take 1 tablet (48 mg total) by mouth daily.   fluconazole  (DIFLUCAN ) 150 MG tablet Take 1 tablet (150 mg total) by mouth daily. (Patient not taking: Reported on  02/19/2024)   No facility-administered encounter medications on file as of 07/29/2024.    Allergies  Allergen Reactions   Penicillins Anaphylaxis    Did it involve swelling of the face/tongue/throat, SOB, or low BP? Yes Did it involve sudden or severe rash/hives, skin peeling, or any reaction on the inside of your mouth or nose? Yes Did you need to seek medical attention at a hospital or doctor's office? Yes When did it last happen?       If all above answers are NO, may proceed with cephalosporin use.    Pertinent ROS per HPI, otherwise unremarkable      Objective:  There were no vitals taken for this visit.   Wt Readings from Last 3 Encounters:  02/19/24 195 lb (88.5 kg)  01/08/24 195 lb 12.8 oz (88.8 kg)  10/03/21 207 lb 12.8 oz (94.3 kg)    Physical Exam Physical Exam      Results for orders placed or performed in visit on 01/08/24  Pregnancy, urine   Collection Time: 01/08/24 12:08 PM  Result Value Ref Range   Preg Test, Ur Negative Negative  CBC with Differential/Platelet   Collection Time: 01/08/24 12:50 PM  Result Value Ref Range   WBC 6.6 3.4 - 10.8 x10E3/uL   RBC 4.20 3.77 - 5.28 x10E6/uL   Hemoglobin 14.1 11.1 - 15.9 g/dL   Hematocrit 57.4 65.9 - 46.6 %   MCV 101 (H) 79 - 97 fL   MCH 33.6 (H) 26.6 - 33.0 pg   MCHC 33.2 31.5 - 35.7 g/dL   RDW 87.5 88.2 - 84.5 %   Platelets 252 150 - 450 x10E3/uL   Neutrophils 58 Not Estab. %   Lymphs 34 Not Estab. %   Monocytes 6 Not Estab. %   Eos 1 Not Estab. %   Basos 1 Not Estab. %   Neutrophils Absolute 3.7 1.4 - 7.0 x10E3/uL   Lymphocytes Absolute 2.3 0.7 - 3.1 x10E3/uL   Monocytes Absolute 0.4 0.1 - 0.9 x10E3/uL   EOS (ABSOLUTE) 0.1 0.0 - 0.4 x10E3/uL   Basophils Absolute 0.0 0.0 - 0.2 x10E3/uL   Immature Granulocytes 0 Not Estab. %   Immature Grans (Abs) 0.0 0.0 - 0.1 x10E3/uL  CMP14+EGFR   Collection Time: 01/08/24 12:50 PM  Result Value Ref Range   Glucose 88 70 - 99 mg/dL   BUN 12 6 - 20 mg/dL    Creatinine, Ser 9.21 0.57 - 1.00 mg/dL   eGFR 898 >40 fO/fpw/8.26   BUN/Creatinine Ratio 15 9 - 23   Sodium 136 134 - 144 mmol/L   Potassium 3.7 3.5 - 5.2 mmol/L   Chloride 99 96 - 106 mmol/L  CO2 20 20 - 29 mmol/L   Calcium 9.0 8.7 - 10.2 mg/dL   Total Protein 6.9 6.0 - 8.5 g/dL   Albumin 4.2 3.9 - 4.9 g/dL   Globulin, Total 2.7 1.5 - 4.5 g/dL   Bilirubin Total 0.9 0.0 - 1.2 mg/dL   Alkaline Phosphatase 139 (H) 44 - 121 IU/L   AST 15 0 - 40 IU/L   ALT 11 0 - 32 IU/L  Lipid panel   Collection Time: 01/08/24 12:50 PM  Result Value Ref Range   Cholesterol, Total 149 100 - 199 mg/dL   Triglycerides 760 (H) 0 - 149 mg/dL   HDL 40 >60 mg/dL   VLDL Cholesterol Cal 39 5 - 40 mg/dL   LDL Chol Calc (NIH) 70 0 - 99 mg/dL   Chol/HDL Ratio 3.7 0.0 - 4.4 ratio  Thyroid  Panel With TSH   Collection Time: 01/08/24 12:50 PM  Result Value Ref Range   TSH 0.699 0.450 - 4.500 uIU/mL   T4, Total 9.4 4.5 - 12.0 ug/dL   T3 Uptake Ratio 28 24 - 39 %   Free Thyroxine Index 2.6 1.2 - 4.9       Pertinent labs & imaging results that were available during my care of the patient were reviewed by me and considered in my medical decision making.  Assessment & Plan:  There are no diagnoses linked to this encounter.   Assessment and Plan Assessment & Plan       Continue all other maintenance medications.  Follow up plan: No follow-ups on file.   Continue healthy lifestyle choices, including diet (rich in fruits, vegetables, and lean proteins, and low in salt and simple carbohydrates) and exercise (at least 30 minutes of moderate physical activity daily).  Educational handout given for ***  The above assessment and management plan was discussed with the patient. The patient verbalized understanding of and has agreed to the management plan. Patient is aware to call the clinic if they develop any new symptoms or if symptoms persist or worsen. Patient is aware when to return to the clinic for a  follow-up visit. Patient educated on when it is appropriate to go to the emergency department.  @SIGNATURE @

## 2024-07-29 ENCOUNTER — Ambulatory Visit: Admitting: Nurse Practitioner

## 2024-07-30 NOTE — Progress Notes (Deleted)
 Subjective:  Patient ID: Tonya Scott, female    DOB: 09/17/1986, 37 y.o.   MRN: [id number]  Patient Care Team: Deitra Morton Sebastian Nena, NP as PCP - General (Nurse Practitioner)   Chief Complaint:  No chief complaint on file.   HPI: Tonya Scott is a 38 y.o. female presenting on 08/03/2024 for No chief complaint on file.   Discussed the use of AI scribe software for clinical note transcription with the patient, who gave verbal consent to proceed.  History of Present Illness       Relevant past medical, surgical, family, and social history reviewed and updated as indicated.  Allergies and medications reviewed and updated. Data reviewed: Chart in Epic.   Past Medical History:  Diagnosis Date   Anxiety    Tobacco abuse     Past Surgical History:  Procedure Laterality Date   iimplanon  03/15/2011   NO PAST SURGERIES      Social History   Socioeconomic History   Marital status: Single    Spouse name: Not on file   Number of children: Not on file   Years of education: Not on file   Highest education level: 12th grade  Occupational History   Not on file  Tobacco Use   Smoking status: Every Day    Current packs/day: 0.20    Average packs/day: 0.2 packs/day for 5.0 years (1.0 ttl pk-yrs)    Types: Cigarettes   Smokeless tobacco: Never  Vaping Use   Vaping status: Never Used  Substance and Sexual Activity   Alcohol use: Yes    Alcohol/week: 4.0 standard drinks of alcohol    Types: 4 Cans of beer per week    Comment: liqour couple weekends a month   Drug use: Yes    Types: Marijuana   Sexual activity: Yes    Partners: Male  Other Topics Concern   Not on file  Social History Narrative   ** Merged History Encounter **       Social Drivers of Health   Financial Resource Strain: Low Risk  (02/19/2024)   Overall Financial Resource Strain (CARDIA)    Difficulty of Paying Living Expenses: Not hard at all  Food Insecurity: No Food Insecurity  (02/19/2024)   Hunger Vital Sign    Worried About Running Out of Food in the Last Year: Never true    Ran Out of Food in the Last Year: Never true  Transportation Needs: No Transportation Needs (02/19/2024)   PRAPARE - Administrator, Civil Service (Medical): No    Lack of Transportation (Non-Medical): No  Recent Concern: Transportation Needs - Unmet Transportation Needs (01/04/2024)   PRAPARE - Administrator, Civil Service (Medical): Yes    Lack of Transportation (Non-Medical): No  Physical Activity: Sufficiently Active (02/19/2024)   Exercise Vital Sign    Days of Exercise per Week: 3 days    Minutes of Exercise per Session: 150+ min  Stress: No Stress Concern Present (02/19/2024)   [employer] of Occupational Health - Occupational Stress Questionnaire    Feeling of Stress : Not at all  Recent Concern: Stress - Stress Concern Present (01/04/2024)   [employer] of Occupational Health - Occupational Stress Questionnaire    Feeling of Stress : To some extent  Social Connections: Socially Isolated (02/19/2024)   Social Connection and Isolation Panel    Frequency of Communication with Friends and Family: More than three times a week  Frequency of Social Gatherings with Friends and Family: More than three times a week    Attends Religious Services: Never    Database administrator or Organizations: No    Attends Banker Meetings: Never    Marital Status: Never married  Intimate Partner Violence: Not At Risk (02/19/2024)   Humiliation, Afraid, Rape, and Kick questionnaire    Fear of Current or Ex-Partner: No    Emotionally Abused: No    Physically Abused: No    Sexually Abused: No    Outpatient Encounter Medications as of 08/03/2024  Medication Sig   fenofibrate  (TRICOR ) 48 MG tablet Take 1 tablet (48 mg total) by mouth daily.   fluconazole  (DIFLUCAN ) 150 MG tablet Take 1 tablet (150 mg total) by mouth daily. (Patient not taking: Reported on  02/19/2024)   No facility-administered encounter medications on file as of 08/03/2024.    Allergies  Allergen Reactions   Penicillins Anaphylaxis    Did it involve swelling of the face/tongue/throat, SOB, or low BP? Yes Did it involve sudden or severe rash/hives, skin peeling, or any reaction on the inside of your mouth or nose? Yes Did you need to seek medical attention at a hospital or doctor's office? Yes When did it last happen?       If all above answers are NO, may proceed with cephalosporin use.    Pertinent ROS per HPI, otherwise unremarkable      Objective:  There were no vitals taken for this visit.   Wt Readings from Last 3 Encounters:  02/19/24 195 lb (88.5 kg)  01/08/24 195 lb 12.8 oz (88.8 kg)  10/03/21 207 lb 12.8 oz (94.3 kg)    Physical Exam Physical Exam      Results for orders placed or performed in visit on 01/08/24  Pregnancy, urine   Collection Time: 01/08/24 12:08 PM  Result Value Ref Range   Preg Test, Ur Negative Negative  CBC with Differential/Platelet   Collection Time: 01/08/24 12:50 PM  Result Value Ref Range   WBC 6.6 3.4 - 10.8 x10E3/uL   RBC 4.20 3.77 - 5.28 x10E6/uL   Hemoglobin 14.1 11.1 - 15.9 g/dL   Hematocrit 57.4 65.9 - 46.6 %   MCV 101 (H) 79 - 97 fL   MCH 33.6 (H) 26.6 - 33.0 pg   MCHC 33.2 31.5 - 35.7 g/dL   RDW 87.5 88.2 - 84.5 %   Platelets 252 150 - 450 x10E3/uL   Neutrophils 58 Not Estab. %   Lymphs 34 Not Estab. %   Monocytes 6 Not Estab. %   Eos 1 Not Estab. %   Basos 1 Not Estab. %   Neutrophils Absolute 3.7 1.4 - 7.0 x10E3/uL   Lymphocytes Absolute 2.3 0.7 - 3.1 x10E3/uL   Monocytes Absolute 0.4 0.1 - 0.9 x10E3/uL   EOS (ABSOLUTE) 0.1 0.0 - 0.4 x10E3/uL   Basophils Absolute 0.0 0.0 - 0.2 x10E3/uL   Immature Granulocytes 0 Not Estab. %   Immature Grans (Abs) 0.0 0.0 - 0.1 x10E3/uL  CMP14+EGFR   Collection Time: 01/08/24 12:50 PM  Result Value Ref Range   Glucose 88 70 - 99 mg/dL   BUN 12 6 - 20 mg/dL    Creatinine, Ser 9.21 0.57 - 1.00 mg/dL   eGFR 898 >40 fO/fpw/8.26   BUN/Creatinine Ratio 15 9 - 23   Sodium 136 134 - 144 mmol/L   Potassium 3.7 3.5 - 5.2 mmol/L   Chloride 99 96 - 106 mmol/L  CO2 20 20 - 29 mmol/L   Calcium 9.0 8.7 - 10.2 mg/dL   Total Protein 6.9 6.0 - 8.5 g/dL   Albumin 4.2 3.9 - 4.9 g/dL   Globulin, Total 2.7 1.5 - 4.5 g/dL   Bilirubin Total 0.9 0.0 - 1.2 mg/dL   Alkaline Phosphatase 139 (H) 44 - 121 IU/L   AST 15 0 - 40 IU/L   ALT 11 0 - 32 IU/L  Lipid panel   Collection Time: 01/08/24 12:50 PM  Result Value Ref Range   Cholesterol, Total 149 100 - 199 mg/dL   Triglycerides 760 (H) 0 - 149 mg/dL   HDL 40 >60 mg/dL   VLDL Cholesterol Cal 39 5 - 40 mg/dL   LDL Chol Calc (NIH) 70 0 - 99 mg/dL   Chol/HDL Ratio 3.7 0.0 - 4.4 ratio  Thyroid  Panel With TSH   Collection Time: 01/08/24 12:50 PM  Result Value Ref Range   TSH 0.699 0.450 - 4.500 uIU/mL   T4, Total 9.4 4.5 - 12.0 ug/dL   T3 Uptake Ratio 28 24 - 39 %   Free Thyroxine Index 2.6 1.2 - 4.9       Pertinent labs & imaging results that were available during my care of the patient were reviewed by me and considered in my medical decision making.  Assessment & Plan:  There are no diagnoses linked to this encounter.   Assessment and Plan Assessment & Plan       Continue all other maintenance medications.  Follow up plan: No follow-ups on file.   Continue healthy lifestyle choices, including diet (rich in fruits, vegetables, and lean proteins, and low in salt and simple carbohydrates) and exercise (at least 30 minutes of moderate physical activity daily).  Educational handout given for ***  The above assessment and management plan was discussed with the patient. The patient verbalized understanding of and has agreed to the management plan. Patient is aware to call the clinic if they develop any new symptoms or if symptoms persist or worsen. Patient is aware when to return to the clinic for a  follow-up visit. Patient educated on when it is appropriate to go to the emergency department.  @SIGNATURE @

## 2024-08-03 ENCOUNTER — Ambulatory Visit: Admitting: Nurse Practitioner
